# Patient Record
Sex: Female | Born: 1995 | Race: White | Hispanic: No | Marital: Single | Smoking: Current every day smoker
Health system: Southern US, Community
[De-identification: ages and names within clinical notes are randomized; demographics above are authoritative.]

## PROBLEM LIST (undated history)

## (undated) HISTORY — PX: TONSILLECTOMY: SUR1361

---
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
## 2017-02-12 ENCOUNTER — Observation Stay (HOSPITAL_COMMUNITY)
Admission: EM | Admit: 2017-02-12 | Discharge: 2017-02-13 | Disposition: A | Discharge status: Home / Self Care | Payer: Self-pay | Attending: Internal Medicine | Admitting: Internal Medicine

## 2017-02-12 ENCOUNTER — Observation Stay (HOSPITAL_COMMUNITY): Payer: Self-pay

## 2017-02-12 ENCOUNTER — Encounter (HOSPITAL_COMMUNITY): Payer: Self-pay | Admitting: *Deleted

## 2017-02-12 ENCOUNTER — Emergency Department (HOSPITAL_COMMUNITY): Payer: Self-pay

## 2017-02-12 DIAGNOSIS — J40 Bronchitis, not specified as acute or chronic: Secondary | ICD-10-CM | POA: Diagnosis present

## 2017-02-12 DIAGNOSIS — D72829 Elevated white blood cell count, unspecified: Secondary | ICD-10-CM

## 2017-02-12 DIAGNOSIS — R739 Hyperglycemia, unspecified: Secondary | ICD-10-CM

## 2017-02-12 DIAGNOSIS — J209 Acute bronchitis, unspecified: Principal | ICD-10-CM | POA: Insufficient documentation

## 2017-02-12 DIAGNOSIS — R0902 Hypoxemia: Secondary | ICD-10-CM | POA: Insufficient documentation

## 2017-02-12 DIAGNOSIS — J9601 Acute respiratory failure with hypoxia: Secondary | ICD-10-CM

## 2017-02-12 DIAGNOSIS — Z720 Tobacco use: Secondary | ICD-10-CM

## 2017-02-12 DIAGNOSIS — F17210 Nicotine dependence, cigarettes, uncomplicated: Secondary | ICD-10-CM | POA: Insufficient documentation

## 2017-02-12 DIAGNOSIS — R0602 Shortness of breath: Secondary | ICD-10-CM

## 2017-02-12 LAB — I-STAT CHEM 8, ED
BUN: 4 mg/dL — ABNORMAL LOW (ref 6–20)
Calcium, Ion: 1.07 mmol/L — ABNORMAL LOW (ref 1.15–1.40)
Chloride: 102 mmol/L (ref 101–111)
Creatinine, Ser: 0.6 mg/dL (ref 0.44–1.00)
Glucose, Bld: 120 mg/dL — ABNORMAL HIGH (ref 65–99)
HEMATOCRIT: 43 % (ref 36.0–46.0)
Hemoglobin: 14.6 g/dL (ref 12.0–15.0)
Potassium: 3.7 mmol/L (ref 3.5–5.1)
Sodium: 137 mmol/L (ref 135–145)
TCO2: 24 mmol/L (ref 0–100)

## 2017-02-12 LAB — CBC
HEMATOCRIT: 41.8 % (ref 36.0–46.0)
Hemoglobin: 14.2 g/dL (ref 12.0–15.0)
MCH: 28.5 pg (ref 26.0–34.0)
MCHC: 34 g/dL (ref 30.0–36.0)
MCV: 83.9 fL (ref 78.0–100.0)
PLATELETS: 395 10*3/uL (ref 150–400)
RBC: 4.98 MIL/uL (ref 3.87–5.11)
RDW: 12.6 % (ref 11.5–15.5)
WBC: 15.5 10*3/uL — ABNORMAL HIGH (ref 4.0–10.5)

## 2017-02-12 LAB — D-DIMER, QUANTITATIVE: D-Dimer, Quant: <0.27 ug/mL-FEU (ref 0.00–0.50)

## 2017-02-12 LAB — TROPONIN I: Troponin I: <0.03 ng/mL (ref <0.03)

## 2017-02-12 MED ORDER — NICOTINE 21 MG/24HR TD PT24
21.0000 mg | MEDICATED_PATCH | Freq: Every day | TRANSDERMAL | Status: DC
  Administered 2017-02-12 – 2017-02-13 (×2): 21 mg via TRANSDERMAL
  Filled 2017-02-12 (×2): qty 1

## 2017-02-12 MED ORDER — ACETAMINOPHEN 325 MG PO TABS: 650.0000 mg | ORAL_TABLET | Freq: Four times a day (QID) | ORAL | Status: DC | PRN

## 2017-02-12 MED ORDER — ACETAMINOPHEN 650 MG RE SUPP: 650.0000 mg | Freq: Four times a day (QID) | RECTAL | Status: DC | PRN

## 2017-02-12 MED ORDER — METHYLPREDNISOLONE SODIUM SUCC 40 MG IJ SOLR
40.0000 mg | Freq: Two times a day (BID) | INTRAMUSCULAR | Status: DC
  Administered 2017-02-12 – 2017-02-13 (×2): 40 mg via INTRAVENOUS
  Filled 2017-02-12 (×2): qty 1

## 2017-02-12 MED ORDER — SODIUM CHLORIDE 0.9 % IV SOLN
INTRAVENOUS | Status: DC
  Administered 2017-02-12: 17:00:00 via INTRAVENOUS

## 2017-02-12 MED ORDER — IPRATROPIUM-ALBUTEROL 0.5-2.5 (3) MG/3ML IN SOLN: 3.0000 mL | RESPIRATORY_TRACT | Status: DC | PRN

## 2017-02-12 MED ORDER — ONDANSETRON HCL 4 MG/2ML IJ SOLN: 4.0000 mg | Freq: Four times a day (QID) | INTRAMUSCULAR | Status: DC | PRN

## 2017-02-12 MED ORDER — AZITHROMYCIN 250 MG PO TABS
500.0000 mg | ORAL_TABLET | Freq: Every day | ORAL | Status: AC
  Administered 2017-02-12: 500 mg via ORAL
  Filled 2017-02-12: qty 2

## 2017-02-12 MED ORDER — IPRATROPIUM-ALBUTEROL 0.5-2.5 (3) MG/3ML IN SOLN
3.0000 mL | Freq: Four times a day (QID) | RESPIRATORY_TRACT | Status: DC
  Administered 2017-02-12 – 2017-02-13 (×3): 3 mL via RESPIRATORY_TRACT
  Filled 2017-02-12 (×3): qty 3

## 2017-02-12 MED ORDER — AZITHROMYCIN 250 MG PO TABS: 250.0000 mg | ORAL_TABLET | Freq: Every day | ORAL | Status: DC

## 2017-02-12 MED ORDER — IOPAMIDOL (ISOVUE-370) INJECTION 76%
100.0000 mL | Freq: Once | INTRAVENOUS | Status: AC | PRN
  Administered 2017-02-12: 100 mL via INTRAVENOUS

## 2017-02-12 MED ORDER — GUAIFENESIN-DM 100-10 MG/5ML PO SYRP
5.0000 mL | ORAL_SOLUTION | ORAL | Status: DC | PRN
  Administered 2017-02-13: 5 mL via ORAL
  Filled 2017-02-12: qty 5

## 2017-02-12 MED ORDER — ONDANSETRON HCL 4 MG PO TABS: 4.0000 mg | ORAL_TABLET | Freq: Four times a day (QID) | ORAL | Status: DC | PRN

## 2017-02-12 MED ORDER — POLYETHYLENE GLYCOL 3350 17 G PO PACK: 17.0000 g | PACK | Freq: Every day | ORAL | Status: DC | PRN

## 2017-02-12 MED ORDER — ALBUTEROL (5 MG/ML) CONTINUOUS INHALATION SOLN
15.0000 mg/h | INHALATION_SOLUTION | Freq: Once | RESPIRATORY_TRACT | Status: AC
  Administered 2017-02-12: 15 mg/h via RESPIRATORY_TRACT
  Filled 2017-02-12: qty 20

## 2017-02-12 MED ORDER — ALBUTEROL SULFATE (2.5 MG/3ML) 0.083% IN NEBU
5.0000 mg | INHALATION_SOLUTION | Freq: Once | RESPIRATORY_TRACT | Status: AC
  Administered 2017-02-12: 5 mg via RESPIRATORY_TRACT
  Filled 2017-02-12: qty 6

## 2017-02-12 MED ORDER — ENOXAPARIN SODIUM 40 MG/0.4ML ~~LOC~~ SOLN
40.0000 mg | SUBCUTANEOUS | Status: DC
  Administered 2017-02-12: 40 mg via SUBCUTANEOUS
  Filled 2017-02-12: qty 0.4

## 2017-02-12 MED ORDER — PREDNISONE 20 MG PO TABS
40.0000 mg | ORAL_TABLET | Freq: Once | ORAL | Status: AC
  Administered 2017-02-12: 40 mg via ORAL
  Filled 2017-02-12: qty 2

## 2017-02-12 NOTE — ED Notes (Signed)
Pt taken to CT.

## 2017-02-12 NOTE — ED Notes (Signed)
Patient ambulated around nurses station. Patient complains of shortness of breath. HR 133 and O2 dropped to 87%

## 2017-02-12 NOTE — ED Provider Notes (Addendum)
[facility]-EMERGENCY DEPT Provider Note   CSN: [id number] Arrival date & time: 02/12/17  0857     History   Chief Complaint Chief Complaint  Patient presents with  . Shortness of Breath    HPI JOLAN MEALOR is a 21 y.o. female.Complains of cough onset yesterday afternoon accompanied by shortness of breath cough was productive one time of "clear slobber" otherwise nonproductive. No fever no chest pain no other associated symptoms are treated self with Mucinex and a nasal spray, without relief nothing makes symptoms better or worse.  HPI  History reviewed. No pertinent past medical history.  There are no active problems to display for this patient.   Past Surgical History:  Procedure Laterality Date  . TONSILLECTOMY      OB History    No data available       Home Medications    Prior to Admission medications   Medication Sig Start Date End Date Taking? Authorizing Provider  albuterol (PROVENTIL HFA;VENTOLIN HFA) 108 (90 BASE) MCG/ACT inhaler Inhale 2 puffs into the lungs every 2 (two) hours as needed for wheezing or shortness of breath (or coughing). Patient not taking: Reported on 01/02/2015 10/27/13   Dione Booze, MD  etonogestrel (NEXPLANON) 68 MG IMPL implant 1 each by Subdermal route once.    Historical Provider, MD  hydrOXYzine (ATARAX/VISTARIL) 25 MG tablet Take 1-2 tablets (25-50 mg total) by mouth every 6 (six) hours as needed for itching (may cause drowsiness). 01/22/15   John Molpus, MD  ibuprofen (ADVIL,MOTRIN) 200 MG tablet Take 400 mg by mouth every 6 (six) hours as needed for headache.    Historical Provider, MD  methylPREDNISolone (MEDROL DOSEPAK) 4 MG tablet Take tapering dose per package instructions. 01/22/15   John Molpus, MD  ranitidine (ZANTAC) 150 MG tablet Take 1 tablet (150 mg total) by mouth 2 (two) times daily. 01/01/15   Hope Orlene Och, NP    Family History No family history on file.  Social History Social History  Substance Use Topics  . Smoking  status: Current Every Day Smoker    Packs/day: 0.50    Types: Cigarettes  . Smokeless tobacco: Never Used  . Alcohol use No  Smokes 1.5 packs per day and no alcohol no drug use   Allergies   Amoxicillin   Review of Systems Review of Systems  Constitutional: Negative.   HENT: Negative.   Respiratory: Positive for cough and shortness of breath.   Cardiovascular: Negative.   Gastrointestinal: Negative.   Musculoskeletal: Negative.   Skin: Negative.   Neurological: Negative.   Psychiatric/Behavioral: Negative.   All other systems reviewed and are negative.    Physical Exam Updated Vital Signs BP 133/88 (BP Location: Left Arm)   Pulse (!) 105   Temp 98.3 F (36.8 C) (Oral)   Resp (!) 32   Ht 5' (1.524 m)   Wt 250 lb (113.4 kg)   LMP 01/22/2017   SpO2 92%   BMI 48.82 kg/m   Physical Exam  Constitutional: She appears well-developed and well-nourished. No distress.  Respiratory distress, speaks in paragraphs  HENT:  Head: Normocephalic and atraumatic.  Eyes: Conjunctivae are normal. Pupils are equal, round, and reactive to light.  Neck: Neck supple. No tracheal deviation present. No thyromegaly present.  Cardiovascular: Normal rate and regular rhythm.   No murmur heard. Pulmonary/Chest: Effort normal. She has wheezes.  Expiratory wheezes  Abdominal: Soft. Bowel sounds are normal. She exhibits no distension. There is no tenderness.  Obese  Musculoskeletal: Normal  range of motion. She exhibits no edema or tenderness.  Neurological: She is alert. Coordination normal.  Skin: Skin is warm and dry. No rash noted.  Psychiatric: She has a normal mood and affect.  Nursing note and vitals reviewed.    ED Treatments / Results  Labs (all labs ordered are listed, but only abnormal results are displayed) Labs Reviewed - No data to display  EKG  EKG Interpretation  Date/Time:  Sunday February 12 2017 09:06:38 EDT Ventricular Rate:  103 PR Interval:    QRS  Duration: 104 QT Interval:  341 QTC Calculation: 447 R Axis:   173 Text Interpretation:  Sinus tachycardia Right axis deviation Low voltage, precordial leads Baseline wander in lead(s) V4 V5 V6 No old tracing to compare Confirmed by Ethelda Chick  MD, Chasten Blaze [phone]) on 02/12/2017 9:15:48 AM      Chest x-ray viewed by me. 10:30 AM breathing is improved after nebulized treatment. She continues to have diffuse and scattered rhonchi appears in no respiratory distress. ED technician reported to me that upon ambulation patient became Tachypneic(she did not count respiratory rate) and pulse oximetry dropped to 87% on room air. Continuous nebulization with albuterol and prednisone ordered Radiology No results found.  Procedures Procedures (including critical care time)  Medications Ordered in ED Medications  albuterol (PROVENTIL) (2.5 MG/3ML) 0.083% nebulizer solution 5 mg (5 mg Nebulization Given 02/12/17 0914)   I-STAT 8 shows serum sodium 137 potassium 3.7 chloride 102 bicarbonate 24 glucose 120 BUN 4 pending 0.6 hemoglobin 14.6 Results for orders placed or performed during the hospital encounter of 02/12/17  CBC  Result Value Ref Range   WBC 15.5 (H) 4.0 - 10.5 K/uL   RBC 4.98 3.87 - 5.11 MIL/uL   Hemoglobin 14.2 12.0 - 15.0 g/dL   HCT 60.4 54.0 - 98.1 %   MCV 83.9 78.0 - 100.0 fL   MCH 28.5 26.0 - 34.0 pg   MCHC 34.0 30.0 - 36.0 g/dL   RDW 19.1 47.8 - 29.5 %   Platelets 395 150 - 400 K/uL  I-stat chem 8, ed  Result Value Ref Range   Sodium 137 135 - 145 mmol/L   Potassium 3.7 3.5 - 5.1 mmol/L   Chloride 102 101 - 111 mmol/L   BUN 4 (L) 6 - 20 mg/dL   Creatinine, Ser 6.21 0.44 - 1.00 mg/dL   Glucose, Bld 308 (H) 65 - 99 mg/dL   Calcium, Ion 6.57 (L) 1.15 - 1.40 mmol/L   TCO2 24 0 - 100 mmol/L   Hemoglobin 14.6 12.0 - 15.0 g/dL   HCT 84.6 96.2 - 95.2 %   Dg Chest 2 View  Result Date: 02/12/2017 CLINICAL DATA:  Shortness of breath.  Started yesterday. EXAM: CHEST  2 VIEW COMPARISON:   None. FINDINGS: There is no focal consolidation. There is no pleural effusion or pneumothorax. The heart and mediastinal contours are unremarkable. The osseous structures are unremarkable. IMPRESSION: No active cardiopulmonary disease. Electronically Signed   By: Elige Ko   On: 02/12/2017 09:57  Chest x-ray viewed by me  Initial Impression / Assessment and Plan / ED Course  I have reviewed the triage vital signs and the nursing notes.  Pertinent labs & imaging results that were available during my care of the patient were reviewed by me and considered in my medical decision making (see chart for details). Hospitalist consulted for overnight stay as patient has oxygen requirement    12:40 PM after treatment with one hour of continuous nebulization  with albuterol and oral prednisone patient became dyspneic with walking in the emergency department and pulse oximetry dropped to 88% consistent with hypoxia D-dimer and respiratory viral panel ordered by me at hospitalist request will be checked by hospitalist physician  Final Clinical Impressions(s) / ED Diagnoses  Diagnoses #1 acute bronchitis #2 hypoxemia Final diagnoses:  None    New Prescriptions New Prescriptions   No medications on file     Doug Sou, MD 02/12/17 1330    Doug Sou, MD 02/12/17 1331

## 2017-02-12 NOTE — ED Notes (Signed)
Pt placed on 2L 02 [place].   

## 2017-02-12 NOTE — H&P (Signed)
History and Physical    [name]:[id number] DOB: 12/12/1995 DOA: 02/12/2017  PCP: No PCP Per Patient   Patient coming from: Home  Chief Complaint: Shortness of Breath, Wheezing, Cough and Chest Pain  HPI: ALEIGH Stokes is a 21 y.o. female with medical history significant of Morbid Obesity (BMI 48.9), Significant Tobacco Abuse, and no other real medical Hx who presented to [facility] ED with a cc of Chest pain, SOB, cough and wheezing for 2 days now. Patient states that she awoke form her sleep with midsternal Chest Pain and was Nauseous and vomited once and became diaphoretic. She subsequently became short of breath and began to have a cough with wheezing. She also complained of chest tightness and described the pain as "some one sitting on her chest." She admitted to coughing up clear sputum. Denied any fevers, chills, or myalgias. Has been around her cousin who was hospitalized with a PNA. Denied any constipation or diarrhea, and no swelling in legs. She still actively smokes and quite extensively at 1.5 PPD. States symptoms were similar to her bronchitis a few years ago but are worse. [facility] was called to admit for Acute Respiratory Failure with Hypoxia and Acute Bronchitis.  ED Course: Was given a continuous Neb Treatment, po Prednisone, Had a CXR done and labwork.  Was placed on 2 Liters of O2 because she became hypoxic when ambulating and had an elevated RR.   Review of Systems: As per HPI otherwise 10 point review of systems negative. Denied any lightheadedness or dizziness.   PAST MEDICAL HISTORY -Morbid Obesity -Tobacco Abuse -Hx of Bronchitis   Past Surgical History:  Procedure Laterality Date  . TONSILLECTOMY     SOCIAL HISTORY She reports that she has been smoking Cigarettes.  She has been smoking about 1.50 packs per day. She has never used smokeless tobacco. She reports that she does not drink alcohol or use drugs. She lives with her boyfriend in [place].   Allergies   Allergen Reactions  . Amoxicillin Nausea And Vomiting and Rash   FAMILY HISTORY -Patient's Mother has Hypertension, Patients Father is deceased and had Liver Cirrhosis and was and Alcoholic and Drug user.   Prior to Admission medications   Medication Sig Start Date End Date Taking? Authorizing Provider  ibuprofen (ADVIL,MOTRIN) 200 MG tablet Take 400 mg by mouth every 6 (six) hours as needed for headache.   Yes Historical Provider, MD  albuterol (PROVENTIL HFA;VENTOLIN HFA) 108 (90 BASE) MCG/ACT inhaler Inhale 2 puffs into the lungs every 2 (two) hours as needed for wheezing or shortness of breath (or coughing). Patient not taking: Reported on 01/02/2015 10/27/13   Dione Booze, MD  etonogestrel (NEXPLANON) 68 MG IMPL implant 1 each by Subdermal route once.    Historical Provider, MD  hydrOXYzine (ATARAX/VISTARIL) 25 MG tablet Take 1-2 tablets (25-50 mg total) by mouth every 6 (six) hours as needed for itching (may cause drowsiness). Patient not taking: Reported on 02/12/2017 01/22/15   Paula Libra, MD  methylPREDNISolone (MEDROL DOSEPAK) 4 MG tablet Take tapering dose per package instructions. Patient not taking: Reported on 02/12/2017 01/22/15   Paula Libra, MD  ranitidine (ZANTAC) 150 MG tablet Take 1 tablet (150 mg total) by mouth 2 (two) times daily. Patient not taking: Reported on 02/12/2017 01/01/15   Janne Napoleon, NP   Physical Exam: Vitals:   02/12/17 1230 02/12/17 1245 02/12/17 1300 02/12/17 1415  BP: 103/75  (!) 118/107 103/64  Pulse:  (!) 116 (!) 117 Marland Kitchen)  110  Resp:  (!) 22 (!) 24 20  Temp:      TempSrc:      SpO2:  94% 94% 95%  Weight:      Height:       Constitutional: WN/WD morbidly obese Caucasian female, in mild respiratory distress Eyes: Lids and conjunctivae normal, sclerae anicteric  ENMT: External Ears, Nose appear normal. Grossly normal hearing. Mucous membranes are moist. Posterior pharynx clear of any exudate or lesions.  Neck: Appears normal, supple, no cervical  masses, normal ROM, no appreciable thyromegaly, no JVD Respiratory: Severely diminished to auscultation with bilateral wheezing and some rhonchi; no rales or crackles. Slightly increased respiratory effort. No accessory muscle use.  Cardiovascular: Tachycardic Rate and Regular Rhythm, no murmurs / rubs / gallops. S1 and S2 auscultated. No pitting extremity edema. 2+ pedal pulses.  Abdomen: Soft, non-tender, non-distended. No masses palpated. No appreciable hepatosplenomegaly. Bowel sounds positive x4.  GU: Deferred. Musculoskeletal: No clubbing / cyanosis of digits/nails. No joint deformity upper and lower extremities. Skin: No rashes, lesions, ulcers on limited skin evaluation. No induration; Warm and dry.  Neurologic: CN 2-12 grossly intact with no focal deficits. Sensation intact in all 4 Extremities. Romberg sign cerebellar reflexes not assessed.  Psychiatric: Normal judgment and insight. Alert and oriented x 3. Normal mood and appropriate affect.   Labs on Admission: I have personally reviewed following labs and imaging studies  CBC:  Recent Labs Lab 02/12/17 1240 02/12/17 1252  WBC 15.5*  --   HGB 14.2 14.6  HCT 41.8 43.0  MCV 83.9  --   PLT 395  --    Basic Metabolic Panel:  Recent Labs Lab 02/12/17 1252  NA 137  K 3.7  CL 102  GLUCOSE 120*  BUN 4*  CREATININE 0.60   GFR: Estimated Creatinine Clearance: 129.8 mL/min (by C-G formula based on SCr of 0.6 mg/dL). Liver Function Tests: No results for input(s): AST, ALT, ALKPHOS, BILITOT, PROT, ALBUMIN in the last 168 hours. No results for input(s): LIPASE, AMYLASE in the last 168 hours. No results for input(s): AMMONIA in the last 168 hours. Coagulation Profile: No results for input(s): INR, PROTIME in the last 168 hours. Cardiac Enzymes: No results for input(s): CKTOTAL, CKMB, CKMBINDEX, TROPONINI in the last 168 hours. BNP (last 3 results) No results for input(s): PROBNP in the last 8760 hours. HbA1C: No results  for input(s): HGBA1C in the last 72 hours. CBG: No results for input(s): GLUCAP in the last 168 hours. Lipid Profile: No results for input(s): CHOL, HDL, LDLCALC, TRIG, CHOLHDL, LDLDIRECT in the last 72 hours. Thyroid Function Tests: No results for input(s): TSH, T4TOTAL, FREET4, T3FREE, THYROIDAB in the last 72 hours. Anemia Panel: No results for input(s): VITAMINB12, FOLATE, FERRITIN, TIBC, IRON, RETICCTPCT in the last 72 hours. Urine analysis:    Component Value Date/Time   COLORURINE YELLOW 05/22/2012 1501   APPEARANCEUR HAZY (A) 05/22/2012 1501   LABSPEC >1.030 (H) 05/22/2012 1501   PHURINE 5.5 05/22/2012 1501   GLUCOSEU NEGATIVE 05/22/2012 1501   HGBUR NEGATIVE 05/22/2012 1501   BILIRUBINUR NEGATIVE 05/22/2012 1501   KETONESUR NEGATIVE 05/22/2012 1501   PROTEINUR NEGATIVE 05/22/2012 1501   UROBILINOGEN 0.2 05/22/2012 1501   NITRITE POSITIVE (A) 05/22/2012 1501   LEUKOCYTESUR NEGATIVE 05/22/2012 1501   Sepsis Labs: !!!!!!!!!!!!!!!!!!!!!!!!!!!!!!!!!!!!!!!!!!!! (procalcitonin:4,lacticidven:4) )No results found for this or any previous visit (from the past 240 hour(s)).   Radiological Exams on Admission: Dg Chest 2 View  Result Date: 02/12/2017 CLINICAL DATA:  Shortness of  breath.  Started yesterday. EXAM: CHEST  2 VIEW COMPARISON:  None. FINDINGS: There is no focal consolidation. There is no pleural effusion or pneumothorax. The heart and mediastinal contours are unremarkable. The osseous structures are unremarkable. IMPRESSION: No active cardiopulmonary disease. Electronically Signed   By: Elige Ko   On: 02/12/2017 09:57    EKG: Independently reviewed. Showed Sinus Rhythm at a rate of 99. No evidence of ST elevation or depression on my interpretation.   Assessment/Plan Active Problems:   Bronchitis  Acute Respiratory Failure with Hypoxia -Patient was Hypoxic Ambulating in the ED -C/w O2 via [place] and Maintain O2 Saturations >92% -Continuous Pulse Oximetry  and Wean O2 as tolerated -Repeat CXR in AM -Patient admits to being exposed to Sick Contact -Check Respiratory Virus Panel and Droplet Precautions -Check CTA Chest to r/o PE; Even though D-Dimer was <0.027 patient Smokes 1.5 PPD, is Obese, Having Chest Pain, and is on Birth Control -Will ned Home O2 Screen prior to D/C  Acute Bronchitis/Wheezing r/o Infectious Etiology and PE -Started Gentle IVF Rehydration with NS at 75 mL/hr -Patients was still wheezing even after Continuous Albuterol Neb -Leukocytosis was 15.5 -Placed on DuoNebs q6h and q2hprn -Placed on Azithromycin po for 5 Days -Given IV Solumedrol 40 BID as she was still significantly wheezing.  -Continue to Monitor Clinical response to intervention  Leukocytosis -Patient's WBC was 15.5 on Admission -Likely from Bronchitis -Chest Xray showed There is no focal consolidation. There is no pleural effusion or pneumothorax. The heart and mediastinal contours are unremarkable.The osseous structures are unremarkable. -Obtain Blood Cx x2 and Sputum Cx -Repeat CBC in AM  Chest Pain r/o ACS -Has risk factors of Morbid Obesity and Active Tobacco Abuse -Cycle Cardiac Troponin I x 3 -Repeat EKG in AM  Tobacco Abuse -Smoking Cessastion Counseling given -Nicotine Patch 21 mg TD   Hyperglycemia -BS was 120 on Admission -Obtain HbA1c -Continue to Monitor and if Necessary place on SSI  Morbid Obesity -BMI 48.9 -Will need Dietician Consultation  DVT prophylaxis: Lovenox 40 mg sq q24h Code Status: FULL CODE Family Communication: Discussed with Boyfriend at bedside Disposition Plan: Discharge Home Consults called: None Admission status: Tele Obs  Merlene Laughter, D.O. [facility] Pager [phone]  If 7PM-7AM, please contact night-coverage [email or web address] Password [email or web address]  02/12/2017, 2:45 PM

## 2017-02-12 NOTE — ED Triage Notes (Signed)
Pt comes in with shortness of breath starting yesterday. She has had a productive cough since yesterday as well. Pt has bilateral inspiratory and expiratory wheezing noted. C/o chest tightness.

## 2017-02-12 NOTE — ED Notes (Signed)
Pt walked around nurses station x2 on RA and held sats at 94% on first lap and dipped down to 88% at end of second lap.  RR were 35 and even, pt states she felt jittery from albuterol tx.  Dr. Vivia Birmingham notified.

## 2017-02-12 NOTE — ED Notes (Signed)
I-stat result not crossing over into system.  Dr. Shela Commons notified and is free texting results into his note.

## 2017-02-12 NOTE — ED Notes (Signed)
Attempting to get IV access.  ER physician at bedside.  Ok to hold off on IV access at this time.

## 2017-02-13 DIAGNOSIS — R6510 Systemic inflammatory response syndrome (SIRS) of non-infectious origin without acute organ dysfunction: Secondary | ICD-10-CM

## 2017-02-13 DIAGNOSIS — B348 Other viral infections of unspecified site: Secondary | ICD-10-CM

## 2017-02-13 DIAGNOSIS — J181 Lobar pneumonia, unspecified organism: Secondary | ICD-10-CM

## 2017-02-13 LAB — RESPIRATORY PANEL BY PCR
ADENOVIRUS-RVPPCR: NOT DETECTED
BORDETELLA PERTUSSIS-RVPCR: NOT DETECTED
CHLAMYDOPHILA PNEUMONIAE-RVPPCR: NOT DETECTED
CORONAVIRUS NL63-RVPPCR: NOT DETECTED
Coronavirus 229E: NOT DETECTED
Coronavirus HKU1: NOT DETECTED
Coronavirus OC43: NOT DETECTED
INFLUENZA A-RVPPCR: NOT DETECTED
INFLUENZA B-RVPPCR: NOT DETECTED
MYCOPLASMA PNEUMONIAE-RVPPCR: NOT DETECTED
Metapneumovirus: NOT DETECTED
PARAINFLUENZA VIRUS 3-RVPPCR: NOT DETECTED
PARAINFLUENZA VIRUS 4-RVPPCR: NOT DETECTED
Parainfluenza Virus 1: NOT DETECTED
Parainfluenza Virus 2: NOT DETECTED
RESPIRATORY SYNCYTIAL VIRUS-RVPPCR: NOT DETECTED
RHINOVIRUS / ENTEROVIRUS - RVPPCR: DETECTED — AB

## 2017-02-13 LAB — COMPREHENSIVE METABOLIC PANEL
ALT: 17 U/L (ref 14–54)
ANION GAP: 8 (ref 5–15)
AST: 17 U/L (ref 15–41)
Albumin: 4.1 g/dL (ref 3.5–5.0)
Alkaline Phosphatase: 66 U/L (ref 38–126)
BILIRUBIN TOTAL: 0.6 mg/dL (ref 0.3–1.2)
BUN: 6 mg/dL (ref 6–20)
CALCIUM: 9.2 mg/dL (ref 8.9–10.3)
CO2: 23 mmol/L (ref 22–32)
Chloride: 106 mmol/L (ref 101–111)
Creatinine, Ser: 0.61 mg/dL (ref 0.44–1.00)
GFR calc Af Amer: >60 mL/min (ref >60)
Glucose, Bld: 152 mg/dL — ABNORMAL HIGH (ref 65–99)
POTASSIUM: 4 mmol/L (ref 3.5–5.1)
Sodium: 137 mmol/L (ref 135–145)
TOTAL PROTEIN: 7.7 g/dL (ref 6.5–8.1)

## 2017-02-13 LAB — CBC
HEMATOCRIT: 41.1 % (ref 36.0–46.0)
HEMOGLOBIN: 13.8 g/dL (ref 12.0–15.0)
MCH: 28.3 pg (ref 26.0–34.0)
MCHC: 33.6 g/dL (ref 30.0–36.0)
MCV: 84.4 fL (ref 78.0–100.0)
Platelets: 325 10*3/uL (ref 150–400)
RBC: 4.87 MIL/uL (ref 3.87–5.11)
RDW: 12.8 % (ref 11.5–15.5)
WBC: 14 10*3/uL — AB (ref 4.0–10.5)

## 2017-02-13 LAB — HIV ANTIBODY (ROUTINE TESTING W REFLEX): HIV SCREEN 4TH GENERATION: NONREACTIVE

## 2017-02-13 LAB — HEMOGLOBIN A1C
HEMOGLOBIN A1C: 5.3 % (ref 4.8–5.6)
Mean Plasma Glucose: 105 mg/dL

## 2017-02-13 LAB — TROPONIN I: Troponin I: <0.03 ng/mL (ref <0.03)

## 2017-02-13 MED ORDER — LEVOFLOXACIN 750 MG PO TABS: 750.0000 mg | ORAL_TABLET | Freq: Every day | ORAL | 0 refills | Status: DC

## 2017-02-13 MED ORDER — ALBUTEROL SULFATE HFA 108 (90 BASE) MCG/ACT IN AERS: 2.0000 | INHALATION_SPRAY | RESPIRATORY_TRACT | 0 refills | Status: DC | PRN

## 2017-02-13 MED ORDER — DM-GUAIFENESIN ER 30-600 MG PO TB12
1.0000 | ORAL_TABLET | Freq: Two times a day (BID) | ORAL | Status: DC
  Administered 2017-02-13: 1 via ORAL
  Filled 2017-02-13: qty 1

## 2017-02-13 MED ORDER — DM-GUAIFENESIN ER 30-600 MG PO TB12: 1.0000 | ORAL_TABLET | Freq: Two times a day (BID) | ORAL | 0 refills | Status: DC

## 2017-02-13 MED ORDER — PREDNISONE 10 MG (21) PO TBPK: ORAL_TABLET | ORAL | 0 refills | Status: DC

## 2017-02-13 MED ORDER — NICOTINE 21 MG/24HR TD PT24: 21.0000 mg | MEDICATED_PATCH | Freq: Every day | TRANSDERMAL | 0 refills | Status: DC

## 2017-02-13 MED ORDER — LEVOFLOXACIN IN D5W 750 MG/150ML IV SOLN
750.0000 mg | INTRAVENOUS | Status: DC
Start: 2017-02-13 — End: 2017-02-13

## 2017-02-13 NOTE — Care Management Note (Signed)
Case Management Note  Patient Details  Name: Patricia Stokes MRN: [id number] Date of Birth: 05-05-1996  Subjective/Objective:                  Pt admitted with bronchitis. She is from home, lives in [place] and is here visiting her mother. She is uninsured and has no PCP. No other needs communicated.    Action/Plan: She is discharging home today with self care. Pt was provided with information on clinics in [place] to follow up with, she will be responsible for contacting them herself. Pt given MATCH voucher to help with cost of medications.   Expected Discharge Date:  02/13/17               Expected Discharge Plan:  Home/Self Care  In-House Referral:  NA  Discharge planning Services  [facility]igent [facility] Clinic, CM Consult, [facility]  Post Acute Care Choice:  NA Choice offered to:  NA  Status of Service:  Completed, signed off  Malcolm Metro, RN 02/13/2017, 1:33 PM

## 2017-02-13 NOTE — Progress Notes (Signed)
Pt discharged home today per Dr. Marland Mcalpine.  Pt's IV site D/C'd and WDL.  Pt's VSS.  Pt provided with home medication list, discharge instructions and prescriptions.  Verbalized understanding.  Pt ambulated off floor in stable condition accompanied by RN.

## 2017-02-13 NOTE — Discharge Summary (Signed)
Physician Discharge Summary  BRENLEIGH COLLET ZOX:[id number] DOB: Feb 27, 1996 DOA: 02/12/2017  PCP: No PCP Per Patient  Admit date: 02/12/2017 Discharge date: 02/13/2017  Admitted From: Home Disposition:  Home  Recommendations for Outpatient Follow-up:  1. Needs to Establish with and Follow up with PCP in 1-2 weeks 2. Please obtain CMP/CBC in one week 3. Repeat CXR in 4-6 weeks to ensure clearing of PNA 4. Follow up on the Sputum Cx as an outpatient  Home Health: No Equipment/Devices: None  Discharge Condition: Stable CODE STATUS: FULL Diet recommendation: Regular Diet  Brief/Interim Summary: ALSIE YOUNES is a 21 y.o. female with medical history significant of Morbid Obesity (BMI 48.9), Significant Tobacco Abuse, and no other real medical Hx who presented to [facility] ED with a cc of Chest pain, SOB, cough and wheezing for 2 days now. Patient states that she awoke form her sleep with midsternal Chest Pain and was Nauseous and vomited once and became diaphoretic. She subsequently became short of breath and began to have a cough with wheezing. She also complained of chest tightness and described the pain as "some one sitting on her chest." She admitted to coughing up clear sputum. Denied any fevers, chills, or myalgias. Has been around her cousin who was hospitalized with a PNA. Denied any constipation or diarrhea, and no swelling in legs. She still actively smokes and quite extensively at 1.5 PPD. States symptoms were similar to her bronchitis a few years ago but are worse. [facility] was called to admit for Acute Respiratory Failure with Hypoxia and Acute Bronchitis. Further workup revealed a Right Upper Lobe PNA and a Respiratory Virus Panel positive for Rhinovirus/Enterovirus. Patient's Abx where changed to Levofloxacin and she was ambulated and did not require Home O2. Patient felt significantly better and also felt as if her wheezing had improved. She was deemed medically stable and transitioned to  oral antibiotics with Levofloxacin and oral Prednisone Taper. At this time she was ready to be D/C'd Home and will need to follow up with and Establish with a PCP at a [facility] [place]. It was strongly advised to her to quit smoking.   Discharge Diagnoses:  Active Problems:   Bronchitis  Acute Respiratory Failure with Hypoxia from likely Right Upper Lobe PNA and Bronchitis, improved -Patient was Hypoxic Ambulating in the ED -Weaned off of O2 -Patient admits to being exposed to Sick Contact -Checked Respiratory Virus Panel and was Positive for Rhinovirus/Enterovirus -Checked CTA Chest to r/o PE even though D-Dimer was <0.027 patient Smokes 1.5 PPD, is Obese, Having Chest Pain, and is on Birth Control -> CTA showed no evidence of Pulmonary Evidence but did show mild nodular airspace disease in the posterior segment of the Right Upper Lobe and a Patchy Area of Ground-Glass Opacity in the Right Upper Lobe Concerning for PNA -Home O2 Screen prior to D/C done and patient did not require Home O2  SIRS from Acute Community Acquired Righty Upper Lobe Pneumonia in conjunction with Acute Bronchitis/Wheezing from Rhinovirus/Enterovirus -Was Tachycardic and Tachypenic with Leukocytosis and focal infection of Right Upper Lobe PNA -Was gently Hydrated with IVF Rehydration with NS at 75 mL/hr -Patient's Wheezing significantly improvevd -Leukocytosis went from 15.5 -> 14.0 -Placed on DuoNebs q6h and q2hprn while hospitalized; Will D/C with Albuterol Inhaler -Placed on Azithromycin po for 5 Days but switched to Levofloxacin; Patient received 1 IV Dose and will be D/C'd on po Levofloxacin 750 mg po Daily x 7 days total -Given IV Solumedrol 40 BID  and transitioned to po Prednisone Taper -Sputum Cx Gram Stain showed Abundant WBC Present, predominantly PMN with few Squamous Epithelial Cells and Abundant Gram Positive Cocci and Moderate Gram Positive Rods and Few GNR Rods with Culture pending -C/w  Mucinex-DM for 5 days  -Will need to have Repeat CXR in 4-6 weeks -Will also need to follow up and establish with PCP at D/C  Leukocytosis, improved -Likely from above -Patient's WBC was 15.5 on Admission and improved to 14.0 -Chest Xray showed There is no focal consolidation. There is no pleural effusion or pneumothorax. The heart and mediastinal contours are unremarkable.The osseous structures are unremarkable. -Sputum Cx -Repeat CBC as an outpatient  Chest Pain r/o'd ACS -Has risk factors of Morbid Obesity and Active Tobacco Abuse -Cycle Cardiac Troponin I x 3 and were <0.03 x 3 -No Re-occurrence of chest pain  Tobacco Abuse -Smoking Cessastion Counseling given especially that she is on Birth Control  -Nicotine Patch 21 mg TD at D/C  Hyperglycemia -Likely Reactive -BS was 120 on Admission; Was 152 this AM -Obtained HbA1c and was 5.3  Morbid Obesity -BMI 48.9 -Weight Loss Counseling given  Discharge Instructions  Discharge Instructions    Call MD for:  difficulty breathing, headache or visual disturbances    Complete by:  As directed    Call MD for:  extreme fatigue    Complete by:  As directed    Call MD for:  persistant dizziness or light-headedness    Complete by:  As directed    Call MD for:  persistant nausea and vomiting    Complete by:  As directed    Call MD for:  severe uncontrolled pain    Complete by:  As directed    Call MD for:  temperature >100.4    Complete by:  As directed    Diet - low sodium heart healthy    Complete by:  As directed    Discharge instructions    Complete by:  As directed    Follow up with PCP and take all medications as prescribed. If symptoms change or worsen please return to the ED for evaluation.   Increase activity slowly    Complete by:  As directed      Allergies as of 02/13/2017      Reactions   Amoxicillin Nausea And Vomiting, Rash      Medication List    STOP taking these medications   etonogestrel 68 MG  Impl implant Commonly known as:  NEXPLANON   hydrOXYzine 25 MG tablet Commonly known as:  ATARAX/VISTARIL   ibuprofen 200 MG tablet Commonly known as:  ADVIL,MOTRIN   methylPREDNISolone 4 MG tablet Commonly known as:  MEDROL DOSEPAK   ranitidine 150 MG tablet Commonly known as:  ZANTAC     TAKE these medications   albuterol 108 (90 Base) MCG/ACT inhaler Commonly known as:  PROVENTIL HFA;VENTOLIN HFA Inhale 2 puffs into the lungs every 2 (two) hours as needed for wheezing or shortness of breath (or coughing).   dextromethorphan-guaiFENesin 30-600 MG 12hr tablet Commonly known as:  MUCINEX DM Take 1 tablet by mouth 2 (two) times daily.   levofloxacin 750 MG tablet Commonly known as:  LEVAQUIN Take 1 tablet (750 mg total) by mouth daily.   nicotine 21 mg/24hr patch Commonly known as:  NICODERM CQ - dosed in mg/24 hours Place 1 patch (21 mg total) onto the skin daily. Start taking on:  02/14/2017   predniSONE 10 MG (21) Tbpk tablet Commonly known  as:  STERAPRED UNI-PAK 21 TAB Take 6 Tablets Day 1, 5 Tablets Day 2, 4 Tablets Day 3, 3 Tablets Day 4, 2 Tablets Day 5, 1 Tablet Day 6 and then Stop Day 7       Allergies  Allergen Reactions  . Amoxicillin Nausea And Vomiting and Rash   Consultations: -None  Procedures/Studies: Dg Chest 2 View  Result Date: 02/12/2017 CLINICAL DATA:  Shortness of breath.  Started yesterday. EXAM: CHEST  2 VIEW COMPARISON:  None. FINDINGS: There is no focal consolidation. There is no pleural effusion or pneumothorax. The heart and mediastinal contours are unremarkable. The osseous structures are unremarkable. IMPRESSION: No active cardiopulmonary disease. Electronically Signed   By: Elige Ko   On: 02/12/2017 09:57   Ct Angio Chest Pe W Or Wo Contrast  Result Date: 02/12/2017 CLINICAL DATA:  Shortness of breath, wheezing, productive cough EXAM: CT ANGIOGRAPHY CHEST WITH CONTRAST TECHNIQUE: Multidetector CT imaging of the chest was performed  using the standard protocol during bolus administration of intravenous contrast. Multiplanar CT image reconstructions and MIPs were obtained to evaluate the vascular anatomy. CONTRAST:  100 mL Isovue 370 COMPARISON:  None. FINDINGS: Cardiovascular: Satisfactory opacification of the pulmonary arteries to the segmental level. No evidence of pulmonary embolism. Normal heart size. No pericardial effusion. Normal caliber thoracic aorta. No thoracic aortic dissection. Mediastinum/Nodes: No enlarged mediastinal, hilar, or axillary lymph nodes. Thyroid gland, trachea, and esophagus demonstrate no significant findings. Lungs/Pleura: Patchy area of ground-glass opacity in the right upper lobe. Nodular airspace disease in the right upper lobe in the posterior segment. Mild lingular atelectasis. No pleural effusion or pneumothorax. Upper Abdomen: No acute abnormality. Musculoskeletal: No chest wall abnormality. No acute or significant osseous findings. Review of the MIP images confirms the above findings. IMPRESSION: 1. No evidence of pulmonary embolus. 2. Mile nodular airspace disease in the posterior segment of the right upper lobe and a patchy area of ground-glass opacity in the right upper lobe most concerning for pneumonia. Electronically Signed   By: Elige Ko   On: 02/12/2017 16:08     Subjective: Seen and examined at bedside and was feeling much better. No nausea or vomiting. States wheezing has improved and she has no more Chest Pain.   Discharge Exam: Vitals:   02/12/17 1638 02/12/17 2257  BP: 112/71 120/75  Pulse: (!) 113 (!) 110  Resp: 20 20  Temp:  97.5 F (36.4 C)   Vitals:   02/12/17 2257 02/13/17 0111 02/13/17 0752 02/13/17 1001  BP: 120/75     Pulse: (!) 110     Resp: 20     Temp: 97.5 F (36.4 C)     TempSrc: Oral     SpO2: 92% 93% 90% 94%  Weight:      Height:       General: Pt is a morbidly obese Caucasian female who is alert, awake, not in acute distress Cardiovascular:  Tachycardic Rate and Regular Rhythm, S1/S2 +, no rubs, no gallops Respiratory: Diminished bilaterally with some expiratory wheezing Right worse than Left, no rhonchi Abdominal: Soft, NT, Distended due to body habitus, bowel sounds + Extremities: no edema, no cyanosis  The results of significant diagnostics from this hospitalization (including imaging, microbiology, ancillary and laboratory) are listed below for reference.    Microbiology: Recent Results (from the past 240 hour(s))  Respiratory Panel by PCR     Status: Abnormal   Collection Time: 02/12/17  1:24 PM  Result Value Ref Range Status   Adenovirus  NOT DETECTED NOT DETECTED Final   Coronavirus 229E NOT DETECTED NOT DETECTED Final   Coronavirus HKU1 NOT DETECTED NOT DETECTED Final   Coronavirus NL63 NOT DETECTED NOT DETECTED Final   Coronavirus OC43 NOT DETECTED NOT DETECTED Final   Metapneumovirus NOT DETECTED NOT DETECTED Final   Rhinovirus / Enterovirus DETECTED (A) NOT DETECTED Final   Influenza A NOT DETECTED NOT DETECTED Final   Influenza B NOT DETECTED NOT DETECTED Final   Parainfluenza Virus 1 NOT DETECTED NOT DETECTED Final   Parainfluenza Virus 2 NOT DETECTED NOT DETECTED Final   Parainfluenza Virus 3 NOT DETECTED NOT DETECTED Final   Parainfluenza Virus 4 NOT DETECTED NOT DETECTED Final   Respiratory Syncytial Virus NOT DETECTED NOT DETECTED Final   Bordetella pertussis NOT DETECTED NOT DETECTED Final   Chlamydophila pneumoniae NOT DETECTED NOT DETECTED Final   Mycoplasma pneumoniae NOT DETECTED NOT DETECTED Final    Comment: Performed at [facility], [address], [place], [place] [address]  Culture, sputum-assessment     Status: None (Preliminary result)   Collection Time: 02/12/17  4:28 PM  Result Value Ref Range Status   Specimen Description EXPECTORATED SPUTUM  Final   Special Requests NONE  Final   Sputum evaluation   Final    THIS SPECIMEN IS ACCEPTABLE FOR SPUTUM CULTURE Performed at [facility]    Report Status PENDING  Incomplete  Culture, respiratory (NON-Expectorated)     Status: None (Preliminary result)   Collection Time: 02/12/17  4:28 PM  Result Value Ref Range Status   Specimen Description EXPECTORATED SPUTUM  Final   Special Requests NONE Reflexed from [id number]  Final   Gram Stain   Final    ABUNDANT WBC PRESENT, PREDOMINANTLY PMN FEW SQUAMOUS EPITHELIAL CELLS PRESENT ABUNDANT GRAM POSITIVE COCCI IN PAIRS MODERATE GRAM POSITIVE RODS FEW GRAM NEGATIVE RODS Performed at [facility], [address], [place], [place] [address]    Culture PENDING  Incomplete   Report Status PENDING  Incomplete    Labs: BNP (last 3 results) No results for input(s): BNP in the last 8760 hours. Basic Metabolic Panel:  Recent Labs Lab 02/12/17 1252 02/13/17 0221  NA 137 137  K 3.7 4.0  CL 102 106  CO2  --  23  GLUCOSE 120* 152*  BUN 4* 6  CREATININE 0.60 0.61  CALCIUM  --  9.2   Liver Function Tests:  Recent Labs Lab 02/13/17 0221  AST 17  ALT 17  ALKPHOS 66  BILITOT 0.6  PROT 7.7  ALBUMIN 4.1   No results for input(s): LIPASE, AMYLASE in the last 168 hours. No results for input(s): AMMONIA in the last 168 hours. CBC:  Recent Labs Lab 02/12/17 1240 02/12/17 1252 02/13/17 0221  WBC 15.5*  --  14.0*  HGB 14.2 14.6 13.8  HCT 41.8 43.0 41.1  MCV 83.9  --  84.4  PLT 395  --  325   Cardiac Enzymes:  Recent Labs Lab 02/12/17 1326 02/12/17 2046 02/13/17 0221  TROPONINI <0.03 <0.03 <0.03   BNP: Invalid input(s): POCBNP CBG: No results for input(s): GLUCAP in the last 168 hours. D-Dimer  Recent Labs  02/12/17 1326  DDIMER <0.27   Hgb A1c  Recent Labs  02/12/17 1240  HGBA1C 5.3   Lipid Profile No results for input(s): CHOL, HDL, LDLCALC, TRIG, CHOLHDL, LDLDIRECT in the last 72 hours. Thyroid function studies No results for input(s): TSH, T4TOTAL, T3FREE, THYROIDAB in the last 72 hours.  Invalid input(s): FREET3 Anemia work  up No results for input(s): VITAMINB12, FOLATE, FERRITIN, TIBC, IRON, RETICCTPCT in the last 72 hours. Urinalysis    Component Value Date/Time   COLORURINE YELLOW 05/22/2012 1501   APPEARANCEUR HAZY (A) 05/22/2012 1501   LABSPEC >1.030 (H) 05/22/2012 1501   PHURINE 5.5 05/22/2012 1501   GLUCOSEU NEGATIVE 05/22/2012 1501   HGBUR NEGATIVE 05/22/2012 1501   BILIRUBINUR NEGATIVE 05/22/2012 1501   KETONESUR NEGATIVE 05/22/2012 1501   PROTEINUR NEGATIVE 05/22/2012 1501   UROBILINOGEN 0.2 05/22/2012 1501   NITRITE POSITIVE (A) 05/22/2012 1501   LEUKOCYTESUR NEGATIVE 05/22/2012 1501   Sepsis Labs Invalid input(s): PROCALCITONIN,  WBC,  LACTICIDVEN Microbiology Recent Results (from the past 240 hour(s))  Respiratory Panel by PCR     Status: Abnormal   Collection Time: 02/12/17  1:24 PM  Result Value Ref Range Status   Adenovirus NOT DETECTED NOT DETECTED Final   Coronavirus 229E NOT DETECTED NOT DETECTED Final   Coronavirus HKU1 NOT DETECTED NOT DETECTED Final   Coronavirus NL63 NOT DETECTED NOT DETECTED Final   Coronavirus OC43 NOT DETECTED NOT DETECTED Final   Metapneumovirus NOT DETECTED NOT DETECTED Final   Rhinovirus / Enterovirus DETECTED (A) NOT DETECTED Final   Influenza A NOT DETECTED NOT DETECTED Final   Influenza B NOT DETECTED NOT DETECTED Final   Parainfluenza Virus 1 NOT DETECTED NOT DETECTED Final   Parainfluenza Virus 2 NOT DETECTED NOT DETECTED Final   Parainfluenza Virus 3 NOT DETECTED NOT DETECTED Final   Parainfluenza Virus 4 NOT DETECTED NOT DETECTED Final   Respiratory Syncytial Virus NOT DETECTED NOT DETECTED Final   Bordetella pertussis NOT DETECTED NOT DETECTED Final   Chlamydophila pneumoniae NOT DETECTED NOT DETECTED Final   Mycoplasma pneumoniae NOT DETECTED NOT DETECTED Final    Comment: Performed at [facility], [address] [place], [place] [address]  Culture, sputum-assessment     Status: None (Preliminary result)   Collection Time:  02/12/17  4:28 PM  Result Value Ref Range Status   Specimen Description EXPECTORATED SPUTUM  Final   Special Requests NONE  Final   Sputum evaluation   Final    THIS SPECIMEN IS ACCEPTABLE FOR SPUTUM CULTURE Performed at [facility]    Report Status PENDING  Incomplete  Culture, respiratory (NON-Expectorated)     Status: None (Preliminary result)   Collection Time: 02/12/17  4:28 PM  Result Value Ref Range Status   Specimen Description EXPECTORATED SPUTUM  Final   Special Requests NONE Reflexed from [id number]  Final   Gram Stain   Final    ABUNDANT WBC PRESENT, PREDOMINANTLY PMN FEW SQUAMOUS EPITHELIAL CELLS PRESENT ABUNDANT GRAM POSITIVE COCCI IN PAIRS MODERATE GRAM POSITIVE RODS FEW GRAM NEGATIVE RODS Performed at [facility], [address] [place], [place] [address]    Culture PENDING  Incomplete   Report Status PENDING  Incomplete   Time coordinating discharge: 35 minutes  SIGNED:  Merlene Laughter, DO [facility] 02/13/2017, 12:49 PM Pager [phone]  If 7PM-7AM, please contact night-coverage [email or web address] Password [email or web address]

## 2017-02-14 LAB — EXPECTORATED SPUTUM ASSESSMENT W REFEX TO RESP CULTURE

## 2017-02-14 LAB — EXPECTORATED SPUTUM ASSESSMENT W GRAM STAIN, RFLX TO RESP C

## 2017-02-15 LAB — CULTURE, RESPIRATORY

## 2017-02-15 LAB — CULTURE, RESPIRATORY W GRAM STAIN: Culture: NORMAL

## 2020-04-13 ENCOUNTER — Other Ambulatory Visit: Payer: Self-pay

## 2020-04-13 ENCOUNTER — Emergency Department (HOSPITAL_COMMUNITY): Payer: Self-pay

## 2020-04-13 ENCOUNTER — Encounter (HOSPITAL_COMMUNITY): Payer: Self-pay

## 2020-04-13 ENCOUNTER — Emergency Department (HOSPITAL_COMMUNITY)
Admission: EM | Admit: 2020-04-13 | Discharge: 2020-04-13 | Disposition: A | Discharge status: Home / Self Care | Payer: Self-pay | Attending: Emergency Medicine | Admitting: Emergency Medicine

## 2020-04-13 DIAGNOSIS — J40 Bronchitis, not specified as acute or chronic: Secondary | ICD-10-CM

## 2020-04-13 DIAGNOSIS — J209 Acute bronchitis, unspecified: Secondary | ICD-10-CM | POA: Insufficient documentation

## 2020-04-13 DIAGNOSIS — F17210 Nicotine dependence, cigarettes, uncomplicated: Secondary | ICD-10-CM | POA: Insufficient documentation

## 2020-04-13 DIAGNOSIS — R059 Cough, unspecified: Secondary | ICD-10-CM

## 2020-04-13 MED ORDER — BENZONATATE 100 MG PO CAPS
100.0000 mg | ORAL_CAPSULE | Freq: Once | ORAL | Status: AC
  Administered 2020-04-13: 15:00:00 100 mg via ORAL
  Filled 2020-04-13: qty 1

## 2020-04-13 MED ORDER — BENZONATATE 100 MG PO CAPS: 100.0000 mg | ORAL_CAPSULE | Freq: Three times a day (TID) | ORAL | 0 refills | Status: DC

## 2020-04-13 MED ORDER — PREDNISONE 50 MG PO TABS
60.0000 mg | ORAL_TABLET | Freq: Once | ORAL | Status: AC
  Administered 2020-04-13: 13:00:00 60 mg via ORAL
  Filled 2020-04-13: qty 1

## 2020-04-13 MED ORDER — PREDNISONE 20 MG PO TABS: 20.0000 mg | ORAL_TABLET | Freq: Every day | ORAL | 0 refills | Status: DC

## 2020-04-13 MED ORDER — ALBUTEROL SULFATE HFA 108 (90 BASE) MCG/ACT IN AERS
1.0000 | INHALATION_SPRAY | RESPIRATORY_TRACT | Status: DC | PRN
  Administered 2020-04-13: 2 via RESPIRATORY_TRACT
  Filled 2020-04-13: qty 6.7

## 2020-04-13 MED ORDER — ALBUTEROL SULFATE HFA 108 (90 BASE) MCG/ACT IN AERS
8.0000 | INHALATION_SPRAY | Freq: Once | RESPIRATORY_TRACT | Status: AC
  Administered 2020-04-13: 11:00:00 8 via RESPIRATORY_TRACT
  Filled 2020-04-13: qty 6.7

## 2020-04-13 NOTE — Discharge Instructions (Signed)
Continue to use inhaler as needed for shortness of breath and wheezing Take Prednisone for the next 5 days. You can start this medicine tomorrow Take Tessalon as needed for cough every 6-8 hours Please return if you are worsening

## 2020-04-13 NOTE — Clinical Social Work Note (Signed)
Transition of Care [facility]) - Emergency Department Mini Assessment  Patient Details  Name: FRANCHESKA VILLEDA MRN: [id number] Date of Birth: [birth year]  Transition of [facility]) CM/SW Contact:    Ewing Schlein, LCSW Phone Number: 04/13/2020, 12:28 PM  Clinical Narrative: Patient is a 24 year old female who presented to the ED for a cough and chest tightness. CSW reviewed chart and patient is currently uninsured and does not have a PCP. CSW called patient to discuss referrals as patient is on contact precautions and patient is agreeable to referrals for [facility] for PCP needs and the financial counselor to screen her for potential Medicaid. CSW called [facility] to make referral. CSW emailed financial counselor to make referral for screening. TOC signing off.  ED Mini Assessment: What brought you to the Emergency Department? : Cough, chest tightness Barriers to Discharge: ED Barriers Resolved Barrier interventions: Referrals to [facility] and financial counselor Means of departure: Car Interventions which prevented an admission or readmission: Other (must enter comment) (Referral to [facility]; referral to financial counselor)  Patient Contact and Communications Key Contact 1: [facility] Key Contact 2: [place] Royalty (financial counselor) Contact Date: 04/13/20   Admission diagnosis:  COUGH,CHEST TIGHTNESS Patient Active Problem List   Diagnosis Date Noted   Bronchitis 02/12/2017   PCP:  Patient, No Pcp Per Pharmacy:   [id number] - [place], [place] - [address] [place] [address] Phone: [phone] Fax: [phone]

## 2020-04-13 NOTE — ED Provider Notes (Signed)
[facility] EMERGENCY DEPARTMENT Provider Note   CSN: [id number] Arrival date & time: 04/13/20  1696     History Chief Complaint  Patient presents with  . Cough    Patricia Stokes is a 24 y.o. female who presents with SOB, cough, wheezing. Pt states that for the past 2 days she's had progressively worsening SOB, dry cough, and wheezing. She reports associated central chest tightness that is worse with exertion. She is a smoker and smokes 2 ppd. She has albuterol but ran out of this yesterday so decided to come to the ED today. Symptoms feels somewhat like when she had pneumonia a couple years ago and had to be admitted. She denies fever, chills, URI symptoms, abdominal pain, N/V/D. No recent surgery/travel/immobilization, hx of cancer, leg swelling, hemoptysis, prior DVT/PE, or hormone use.   HPI     History reviewed. No pertinent past medical history.  Patient Active Problem List   Diagnosis Date Noted  . Bronchitis 02/12/2017    Past Surgical History:  Procedure Laterality Date  . TONSILLECTOMY       OB History   No obstetric history on file.     No family history on file.  Social History   Tobacco Use  . Smoking status: Current Every Day Smoker    Packs/day: 0.50    Types: Cigarettes  . Smokeless tobacco: Never Used  Substance Use Topics  . Alcohol use: No  . Drug use: No    Home Medications Prior to Admission medications   Medication Sig Start Date End Date Taking? Authorizing Provider  albuterol (PROVENTIL HFA;VENTOLIN HFA) 108 (90 Base) MCG/ACT inhaler Inhale 2 puffs into the lungs every 2 (two) hours as needed for wheezing or shortness of breath (or coughing). 02/13/17   Sheikh, Omair Latif, DO  dextromethorphan-guaiFENesin [facility] DM) 30-600 MG 12hr tablet Take 1 tablet by mouth 2 (two) times daily. 02/13/17   Sheikh, Omair Latif, DO  levofloxacin (LEVAQUIN) 750 MG tablet Take 1 tablet (750 mg total) by mouth daily. 02/13/17   Sheikh, Omair Latif, DO    nicotine (NICODERM CQ - DOSED IN MG/24 HOURS) 21 mg/24hr patch Place 1 patch (21 mg total) onto the skin daily. 02/14/17   Sheikh, Omair Latif, DO  predniSONE (STERAPRED UNI-PAK 21 TAB) 10 MG (21) TBPK tablet Take 6 Tablets Day 1, 5 Tablets Day 2, 4 Tablets Day 3, 3 Tablets Day 4, 2 Tablets Day 5, 1 Tablet Day 6 and then Stop Day 7 02/13/17   Marguerita Merles Latif, DO  diphenhydrAMINE (BENADRYL) 25 MG tablet Take at bedtime prn itching. 01/01/15 01/22/15  Janne Napoleon, NP  loratadine (CLARITIN) 10 MG tablet Take 1 tablet (10 mg total) by mouth daily. 01/01/15 01/22/15  Janne Napoleon, NP    Allergies    Amoxicillin  Review of Systems   Review of Systems  Constitutional: Negative for chills and fever.  HENT: Negative for rhinorrhea and sore throat.   Respiratory: Positive for cough, chest tightness, shortness of breath and wheezing.   Cardiovascular: Negative for chest pain and leg swelling.  All other systems reviewed and are negative.   Physical Exam Updated Vital Signs BP 102/67 (BP Location: Right Arm)   Pulse 85   Temp 98.4 F (36.9 C) (Oral)   Ht 5' (1.524 m)   Wt 113.4 kg   LMP 03/23/2020 (Approximate)   SpO2 96%   BMI 48.82 kg/m   Physical Exam Vitals and nursing note reviewed.  Constitutional:  General: She is not in acute distress.    Appearance: She is well-developed. She is obese. She is not ill-appearing.  HENT:     Head: Normocephalic and atraumatic.     Right Ear: Tympanic membrane normal.     Left Ear: Tympanic membrane normal.     Nose: Rhinorrhea present.     Mouth/Throat:     Mouth: Mucous membranes are moist.  Eyes:     General: No scleral icterus.       Right eye: No discharge.        Left eye: No discharge.     Conjunctiva/sclera: Conjunctivae normal.     Pupils: Pupils are equal, round, and reactive to light.  Cardiovascular:     Rate and Rhythm: Normal rate and regular rhythm.  Pulmonary:     Effort: Pulmonary effort is normal. No respiratory  distress.     Breath sounds: Wheezing (diffuse expiratory wheezing) present.  Abdominal:     General: There is no distension.  Musculoskeletal:     Cervical back: Normal range of motion.  Skin:    General: Skin is warm and dry.  Neurological:     Mental Status: She is alert and oriented to person, place, and time.  Psychiatric:        Behavior: Behavior normal.     ED Results / Procedures / Treatments   Labs (all labs ordered are listed, but only abnormal results are displayed) Labs Reviewed - No data to display  EKG EKG Interpretation  Date/Time:  Monday April 13 2020 09:31:50 EDT Ventricular Rate:  87 PR Interval:  140 QRS Duration: 78 QT Interval:  348 QTC Calculation: 418 R Axis:   101 Text Interpretation: Normal sinus rhythm Rightward axis Borderline ECG No STEMI Confirmed by Alvester Chou [phone]) on 04/13/2020 9:36:38 AM   Radiology DG Chest Port 1 View  Result Date: 04/13/2020 CLINICAL DATA:  Cough. Additional history provided: Patient reports cough and shortness of breath since yesterday, chest heaviness. EXAM: PORTABLE CHEST 1 VIEW COMPARISON:  CT angiogram chest 02/12/2017, chest radiograph 02/12/2017 FINDINGS: Heart size within normal limits. There is no appreciable airspace consolidation. No evidence of pleural effusion or pneumothorax. No acute bony abnormality identified. IMPRESSION: No evidence of acute cardiopulmonary abnormality. Electronically Signed   By: Jackey Loge DO   On: 04/13/2020 10:58    Procedures Procedures (including critical care time)  Medications Ordered in ED Medications - No data to display  ED Course  I have reviewed the triage vital signs and the nursing notes.  Pertinent labs & imaging results that were available during my care of the patient were reviewed by me and considered in my medical decision making (see chart for details).  24 year old female presents with cough, chest tightness, wheezing and shortness of breath for  several days.  She has diffuse expiratory wheezes on exam.  She is a heavy smoker.  EKG is normal sinus rhythm.  Chest x-ray was obtained which is negative.  Patient had several rounds of an albuterol inhaler here and on repeat exam her lung sounds were improved.  She was also given a dose of prednisone and she states that she feels very shaky afterwards which could be related to the albuterol treatment as well.  Patient was ambulated and was mildly hypoxic to 88% but quickly rebounded with rest.  Overall she is feeling much better after treatment here.  Will attempt outpatient treatment with albuterol and a lower dose of steroids and she is not  tolerating 60 mg of prednisone.  Also give her prescription cough medicine.  Advised return if worsening.  MDM Rules/Calculators/A&P                           Final Clinical Impression(s) / ED Diagnoses Final diagnoses:  Bronchitis    Rx / DC Orders ED Discharge Orders    None       Bethel Born, PA-C 04/13/20 1642    Terald Sleeper, MD 04/13/20 [phone]

## 2020-04-13 NOTE — ED Notes (Signed)
Pulse oxygen 88% when ambulating

## 2020-04-13 NOTE — ED Triage Notes (Signed)
Pt reports cough and sob since yesterday.  Reports chest heaviness.

## 2020-04-15 ENCOUNTER — Other Ambulatory Visit: Payer: Self-pay

## 2020-04-15 ENCOUNTER — Emergency Department (HOSPITAL_COMMUNITY): Payer: Self-pay

## 2020-04-15 ENCOUNTER — Observation Stay (HOSPITAL_COMMUNITY)
Admission: EM | Admit: 2020-04-15 | Discharge: 2020-04-17 | Disposition: A | Discharge status: Home / Self Care | Payer: Self-pay | Attending: Internal Medicine | Admitting: Internal Medicine

## 2020-04-15 ENCOUNTER — Encounter (HOSPITAL_COMMUNITY): Payer: Self-pay | Admitting: *Deleted

## 2020-04-15 DIAGNOSIS — F17210 Nicotine dependence, cigarettes, uncomplicated: Secondary | ICD-10-CM | POA: Insufficient documentation

## 2020-04-15 DIAGNOSIS — J40 Bronchitis, not specified as acute or chronic: Secondary | ICD-10-CM

## 2020-04-15 DIAGNOSIS — B974 Respiratory syncytial virus as the cause of diseases classified elsewhere: Secondary | ICD-10-CM | POA: Insufficient documentation

## 2020-04-15 DIAGNOSIS — Z6841 Body Mass Index (BMI) 40.0 and over, adult: Secondary | ICD-10-CM | POA: Insufficient documentation

## 2020-04-15 DIAGNOSIS — J9601 Acute respiratory failure with hypoxia: Secondary | ICD-10-CM | POA: Insufficient documentation

## 2020-04-15 DIAGNOSIS — Z79899 Other long term (current) drug therapy: Secondary | ICD-10-CM | POA: Insufficient documentation

## 2020-04-15 DIAGNOSIS — Z7952 Long term (current) use of systemic steroids: Secondary | ICD-10-CM | POA: Insufficient documentation

## 2020-04-15 DIAGNOSIS — Z20822 Contact with and (suspected) exposure to covid-19: Secondary | ICD-10-CM | POA: Insufficient documentation

## 2020-04-15 DIAGNOSIS — F1290 Cannabis use, unspecified, uncomplicated: Secondary | ICD-10-CM | POA: Insufficient documentation

## 2020-04-15 DIAGNOSIS — J205 Acute bronchitis due to respiratory syncytial virus: Principal | ICD-10-CM | POA: Insufficient documentation

## 2020-04-15 LAB — TROPONIN I (HIGH SENSITIVITY): Troponin I (High Sensitivity): 3 ng/L (ref <18)

## 2020-04-15 LAB — CBC WITH DIFFERENTIAL/PLATELET
Abs Immature Granulocytes: 0.04 10*3/uL (ref 0.00–0.07)
Basophils Absolute: 0 10*3/uL (ref 0.0–0.1)
Basophils Relative: 0 %
Eosinophils Absolute: 0 10*3/uL (ref 0.0–0.5)
Eosinophils Relative: 0 %
HCT: 41.8 % (ref 36.0–46.0)
Hemoglobin: 13.4 g/dL (ref 12.0–15.0)
Immature Granulocytes: 0 %
Lymphocytes Relative: 12 %
Lymphs Abs: 1.3 10*3/uL (ref 0.7–4.0)
MCH: 28.5 pg (ref 26.0–34.0)
MCHC: 32.1 g/dL (ref 30.0–36.0)
MCV: 88.9 fL (ref 80.0–100.0)
Monocytes Absolute: 0.5 10*3/uL (ref 0.1–1.0)
Monocytes Relative: 5 %
Neutro Abs: 8.9 10*3/uL — ABNORMAL HIGH (ref 1.7–7.7)
Neutrophils Relative %: 83 %
Platelets: 373 10*3/uL (ref 150–400)
RBC: 4.7 MIL/uL (ref 3.87–5.11)
RDW: 13.2 % (ref 11.5–15.5)
WBC: 10.7 10*3/uL — ABNORMAL HIGH (ref 4.0–10.5)
nRBC: 0 % (ref 0.0–0.2)

## 2020-04-15 LAB — SARS CORONAVIRUS 2 BY RT PCR (HOSPITAL ORDER, PERFORMED IN ~~LOC~~ HOSPITAL LAB): SARS Coronavirus 2: NEGATIVE

## 2020-04-15 LAB — BASIC METABOLIC PANEL
Anion gap: 12 (ref 5–15)
BUN: 6 mg/dL (ref 6–20)
CO2: 21 mmol/L — ABNORMAL LOW (ref 22–32)
Calcium: 8.5 mg/dL — ABNORMAL LOW (ref 8.9–10.3)
Chloride: 105 mmol/L (ref 98–111)
Creatinine, Ser: 0.65 mg/dL (ref 0.44–1.00)
GFR calc Af Amer: >60 mL/min (ref >60)
GFR calc non Af Amer: >60 mL/min (ref >60)
Glucose, Bld: 121 mg/dL — ABNORMAL HIGH (ref 70–99)
Potassium: 3.6 mmol/L (ref 3.5–5.1)
Sodium: 138 mmol/L (ref 135–145)

## 2020-04-15 LAB — RAPID URINE DRUG SCREEN, HOSP PERFORMED
Amphetamines: NOT DETECTED
Barbiturates: NOT DETECTED
Benzodiazepines: NOT DETECTED
Cocaine: NOT DETECTED
Opiates: NOT DETECTED
Tetrahydrocannabinol: POSITIVE — AB

## 2020-04-15 LAB — D-DIMER, QUANTITATIVE: D-Dimer, Quant: <0.27 ug/mL-FEU (ref 0.00–0.50)

## 2020-04-15 LAB — POC URINE PREG, ED: Preg Test, Ur: NEGATIVE

## 2020-04-15 MED ORDER — IPRATROPIUM-ALBUTEROL 0.5-2.5 (3) MG/3ML IN SOLN
3.0000 mL | Freq: Once | RESPIRATORY_TRACT | Status: AC
  Administered 2020-04-15: 3 mL via RESPIRATORY_TRACT
  Filled 2020-04-15: qty 3

## 2020-04-15 MED ORDER — AEROCHAMBER PLUS FLO-VU MISC
1.0000 | Freq: Once | Status: AC
  Administered 2020-04-15: 1
  Filled 2020-04-15: qty 1

## 2020-04-15 MED ORDER — ACETAMINOPHEN 650 MG RE SUPP: 650.0000 mg | Freq: Four times a day (QID) | RECTAL | Status: DC | PRN

## 2020-04-15 MED ORDER — ONDANSETRON HCL 4 MG/2ML IJ SOLN: 4.0000 mg | Freq: Four times a day (QID) | INTRAMUSCULAR | Status: DC | PRN

## 2020-04-15 MED ORDER — POLYETHYLENE GLYCOL 3350 17 G PO PACK: 17.0000 g | PACK | Freq: Every day | ORAL | Status: DC | PRN

## 2020-04-15 MED ORDER — ALBUTEROL SULFATE (2.5 MG/3ML) 0.083% IN NEBU: 2.5000 mg | INHALATION_SOLUTION | RESPIRATORY_TRACT | Status: DC | PRN

## 2020-04-15 MED ORDER — NICOTINE 14 MG/24HR TD PT24
14.0000 mg | MEDICATED_PATCH | Freq: Every day | TRANSDERMAL | Status: DC
  Filled 2020-04-15: qty 1

## 2020-04-15 MED ORDER — METHYLPREDNISOLONE SODIUM SUCC 40 MG IJ SOLR
125.0000 mg | Freq: Once | INTRAMUSCULAR | Status: AC
  Administered 2020-04-15: 125 mg via INTRAVENOUS
  Filled 2020-04-15: qty 4

## 2020-04-15 MED ORDER — GUAIFENESIN-DM 100-10 MG/5ML PO SYRP
5.0000 mL | ORAL_SOLUTION | Freq: Four times a day (QID) | ORAL | Status: AC
  Administered 2020-04-15 – 2020-04-16 (×5): 5 mL via ORAL
  Filled 2020-04-15 (×5): qty 5

## 2020-04-15 MED ORDER — METHYLPREDNISOLONE SODIUM SUCC 125 MG IJ SOLR
60.0000 mg | Freq: Two times a day (BID) | INTRAMUSCULAR | Status: DC
  Administered 2020-04-16 – 2020-04-17 (×3): 60 mg via INTRAVENOUS
  Filled 2020-04-15 (×3): qty 2

## 2020-04-15 MED ORDER — ALBUTEROL SULFATE HFA 108 (90 BASE) MCG/ACT IN AERS
4.0000 | INHALATION_SPRAY | Freq: Once | RESPIRATORY_TRACT | Status: AC
  Administered 2020-04-15: 4 via RESPIRATORY_TRACT
  Filled 2020-04-15: qty 6.7

## 2020-04-15 MED ORDER — ACETAMINOPHEN 325 MG PO TABS: 650.0000 mg | ORAL_TABLET | Freq: Four times a day (QID) | ORAL | Status: DC | PRN

## 2020-04-15 MED ORDER — ALBUTEROL SULFATE (2.5 MG/3ML) 0.083% IN NEBU
2.5000 mg | INHALATION_SOLUTION | Freq: Four times a day (QID) | RESPIRATORY_TRACT | Status: DC
  Administered 2020-04-16 (×2): 2.5 mg via RESPIRATORY_TRACT
  Filled 2020-04-15: qty 3

## 2020-04-15 MED ORDER — ONDANSETRON HCL 4 MG PO TABS: 4.0000 mg | ORAL_TABLET | Freq: Four times a day (QID) | ORAL | Status: DC | PRN

## 2020-04-15 MED ORDER — ENOXAPARIN SODIUM 60 MG/0.6ML ~~LOC~~ SOLN
50.0000 mg | SUBCUTANEOUS | Status: DC
  Administered 2020-04-15 – 2020-04-16 (×2): 50 mg via SUBCUTANEOUS
  Filled 2020-04-15 (×2): qty 0.6

## 2020-04-15 NOTE — ED Notes (Signed)
Respiratory contacted for nebulizer treatment. ° °

## 2020-04-15 NOTE — ED Notes (Signed)
Patient drops to 88 O2 saturation with ambulation.

## 2020-04-15 NOTE — ED Triage Notes (Signed)
Pt was at ED 2 days ago and was diagnosed with bronchitis. Pt has been using her inhalers but reports her SOB is getting worse.

## 2020-04-15 NOTE — H&P (Addendum)
History and Physical    Patricia Stokes:[id number] DOB: 1995-11-22 DOA: 04/15/2020  PCP: Patient, No Pcp Per   Patient coming from: Home  I have personally briefly reviewed patient's old medical records in [facility]  Chief Complaint: Difficulty breathing, productive cough  HPI: Patricia Stokes is a 24 y.o. female with medical history significant for bronchitis, morbid obesity.  Patient presented to the ED with complaints of difficulty breathing and productive cough, ongoing for the past 4 days and getting worse.   Patient was in the ED 2 days ago with similar complaints, also including cough and wheezing, she was diagnosed with bronchitis, discharged home with prednisone albuterol inhaler and Tessalon, despite compliance with treatment, symptoms have persisted, are milder when she is at rest, but increases with activity. No chest pains, lower extremity swelling.  No family history of asthma.  She still smokes 2 packs of cigarettes daily.  ED Course: O2 sats 92 to 97% on room air, but with ambulation patient's O2 sats dropped to 88%.  WBC 10.7.  D-dimer and troponin x 1 unremarkable.  Portable chest x-ray without acute abnormality.  Covid test negative.  DuoNebs, Solu-Medrol given in ED, hospitalist to admit for acute respiratory failure secondary to bronchitis.  Review of Systems: As per HPI all other systems reviewed and negative.  History reviewed. No pertinent past medical history.  Past Surgical History:  Procedure Laterality Date  . TONSILLECTOMY       reports that she has been smoking cigarettes. She has been smoking about 0.50 packs per day. She has never used smokeless tobacco. She reports that she does not drink alcohol and does not use drugs.  Allergies  Allergen Reactions  . Amoxicillin Nausea And Vomiting and Rash   No family history of asthma.  Prior to Admission medications   Medication Sig Start Date End Date Taking? Authorizing Provider  albuterol  (PROVENTIL HFA;VENTOLIN HFA) 108 (90 Base) MCG/ACT inhaler Inhale 2 puffs into the lungs every 2 (two) hours as needed for wheezing or shortness of breath (or coughing). 02/13/17   Sheikh, Omair Latif, DO  benzonatate (TESSALON) 100 MG capsule Take 1 capsule (100 mg total) by mouth every 8 (eight) hours. 04/13/20   Bethel Born, PA-C  dextromethorphan-guaiFENesin (MUCINEX DM) 30-600 MG 12hr tablet Take 1 tablet by mouth 2 (two) times daily. 02/13/17   Sheikh, Omair Latif, DO  levofloxacin (LEVAQUIN) 750 MG tablet Take 1 tablet (750 mg total) by mouth daily. 02/13/17   Sheikh, Omair Latif, DO  nicotine (NICODERM CQ - DOSED IN MG/24 HOURS) 21 mg/24hr patch Place 1 patch (21 mg total) onto the skin daily. 02/14/17   Sheikh, Omair Latif, DO  predniSONE (DELTASONE) 20 MG tablet Take 1 tablet (20 mg total) by mouth daily. 04/13/20   Bethel Born, PA-C  diphenhydrAMINE (BENADRYL) 25 MG tablet Take at bedtime prn itching. 01/01/15 01/22/15  Janne Napoleon, NP  loratadine (CLARITIN) 10 MG tablet Take 1 tablet (10 mg total) by mouth daily. 01/01/15 01/22/15  Janne Napoleon, NP    Physical Exam: Vitals:   04/15/20 1521 04/15/20 1623 04/15/20 1728 04/15/20 1810  BP: (!) 131/57   136/76  Pulse: (!) 101   100  Resp: 18   20  Temp:      TempSrc:      SpO2: 92% 94% 97% 96%  Weight:      Height:        Constitutional: NAD, calm, comfortable Vitals:  04/15/20 1521 04/15/20 1623 04/15/20 1728 04/15/20 1810  BP: (!) 131/57   136/76  Pulse: (!) 101   100  Resp: 18   20  Temp:      TempSrc:      SpO2: 92% 94% 97% 96%  Weight:      Height:       Eyes: PERRL, lids and conjunctivae normal ENMT: Mucous membranes are moist. Posterior pharynx clear of any exudate or lesions.Normal dentition.  Neck: normal, supple, no masses, no thyromegaly Respiratory: Faint occasional expiratory wheezing, no crackles. Normal respiratory effort. No accessory muscle use.  Cardiovascular: Regular rate and rhythm, No extremity  edema. 2+ pedal pulses. Abdomen: no tenderness, no masses palpated. No hepatosplenomegaly. Bowel sounds positive.  Musculoskeletal: no clubbing / cyanosis. No joint deformity upper and lower extremities. Good ROM, no contractures. Normal muscle tone.  Skin: no rashes, lesions, ulcers. No induration Neurologic: No apparent cranial nerve abnormality, moving all extremities spontaneously. Psychiatric: Normal judgment and insight. Alert and oriented x 3. Normal mood.   Labs on Admission: I have personally reviewed following labs and imaging studies  CBC: Recent Labs  Lab 04/15/20 1600  WBC 10.7*  NEUTROABS 8.9*  HGB 13.4  HCT 41.8  MCV 88.9  PLT 373   Basic Metabolic Panel: Recent Labs  Lab 04/15/20 1600  NA 138  K 3.6  CL 105  CO2 21*  GLUCOSE 121*  BUN 6  CREATININE 0.65  CALCIUM 8.5*    Radiological Exams on Admission: DG Chest 2 View  Result Date: 04/15/2020 CLINICAL DATA:  Shortness of breath EXAM: CHEST - 2 VIEW COMPARISON:  04/13/2020 FINDINGS: The heart size and mediastinal contours are within normal limits. Both lungs are clear. No pleural effusion or pneumothorax. The visualized skeletal structures are unremarkable. IMPRESSION: No acute process in the chest. Electronically Signed   By: Guadlupe Spanish M.D.   On: 04/15/2020 13:27    EKG: Independently reviewed.  Sinus rhythm, rate 103, QTc 434.  No significant ST or T wave changes compared to prior EKG.  Assessment/Plan Principal Problem:   Bronchitis Active Problems:   Morbid obesity with BMI of 45.0-49.9, adult (HCC)  Acute bronchitis with acute hypoxic respiratory failure-dyspnea, productive cough, O2 sats down to 88% on room air with ambulation.  Covid test negative, portable chest x-ray unremarkable.  WBC 10.7, D-dimer, troponin EKG unremarkable also.  Patient continues to smoke.  Hospitalization in 2018 with bronchitis, but at that time in the setting of pneumonia also. -125 Solu-Medrol given, continuous 60  every 12 hourly -Albuterol nebulizers scheduled and as needed -Mucolytic scheduled -UDS - May need to follow-up with outpatient provider for PFTs. -Held off on antibiotics at this time -Daily fasting blood glucose while on steroids.  Tobacco abuse-smokes 2 pack of cigarettes daily. -I have extensively counseled patient on why she needs to quit smoking.  She voiced understanding. -Nicotine patch ordered  Morbid obesity-BMI of 48.  DVT prophylaxis: Lovenox Code Status: Full code Family Communication: None at bedside Disposition Plan: 1 to 2 days Consults called: None Admission status: Observation, MedSurg   Onnie Boer MD [facility]  04/15/2020, 6:52 PM

## 2020-04-15 NOTE — ED Provider Notes (Addendum)
[facility] EMERGENCY DEPARTMENT Provider Note   CSN: [id number] Arrival date & time: 04/15/20  1043     History Chief Complaint  Patient presents with  . Shortness of Breath    Patricia Stokes is a 24 y.o. female history of obesity, smoker otherwise healthy no daily medication use.  Patient presents today for cough, wheezing and shortness of breath.  Symptom onset 4 days ago, progressively worsening shortness of breath, nonproductive cough and wheezing.  She reports mild burning chest pain only when she coughs, nonradiating, improves when she stops coughing.  Patient reports she was seen in the ER 2 days ago, she was prescribed cough medicine, albuterol and prednisone which she has been taking as prescribed without improvement in her symptoms.    Patient reports that she has continued to smoke cigarettes but has "not inhaled them".  Denies fever/chills, headache, neck stiffness, sore throat, hemoptysis, abdominal pain, nausea/vomiting, diarrhea, extremity swelling/color change, history of blood clot, recent travel/immobilization, recent surgery, history of cancer or any additional concerns.  Of note patient does have Implanon.   HPI     History reviewed. No pertinent past medical history.  Patient Active Problem List   Diagnosis Date Noted  . Bronchitis 02/12/2017    Past Surgical History:  Procedure Laterality Date  . TONSILLECTOMY       OB History   No obstetric history on file.     No family history on file.  Social History   Tobacco Use  . Smoking status: Current Every Day Smoker    Packs/day: 0.50    Types: Cigarettes  . Smokeless tobacco: Never Used  Substance Use Topics  . Alcohol use: No  . Drug use: No    Home Medications Prior to Admission medications   Medication Sig Start Date End Date Taking? Authorizing Provider  albuterol (PROVENTIL HFA;VENTOLIN HFA) 108 (90 Base) MCG/ACT inhaler Inhale 2 puffs into the lungs every 2 (two) hours as needed for  wheezing or shortness of breath (or coughing). 02/13/17   Sheikh, Omair Latif, DO  benzonatate (TESSALON) 100 MG capsule Take 1 capsule (100 mg total) by mouth every 8 (eight) hours. 04/13/20   Bethel Born, PA-C  dextromethorphan-guaiFENesin (MUCINEX DM) 30-600 MG 12hr tablet Take 1 tablet by mouth 2 (two) times daily. 02/13/17   Sheikh, Omair Latif, DO  levofloxacin (LEVAQUIN) 750 MG tablet Take 1 tablet (750 mg total) by mouth daily. 02/13/17   Sheikh, Omair Latif, DO  nicotine (NICODERM CQ - DOSED IN MG/24 HOURS) 21 mg/24hr patch Place 1 patch (21 mg total) onto the skin daily. 02/14/17   Sheikh, Omair Latif, DO  predniSONE (DELTASONE) 20 MG tablet Take 1 tablet (20 mg total) by mouth daily. 04/13/20   Bethel Born, PA-C  diphenhydrAMINE (BENADRYL) 25 MG tablet Take at bedtime prn itching. 01/01/15 01/22/15  Janne Napoleon, NP  loratadine (CLARITIN) 10 MG tablet Take 1 tablet (10 mg total) by mouth daily. 01/01/15 01/22/15  Janne Napoleon, NP    Allergies    Amoxicillin  Review of Systems   Review of Systems Ten systems are reviewed and are negative for acute change except as noted in the HPI  Physical Exam Updated Vital Signs BP 136/76   Pulse 100   Temp 98.1 F (36.7 C) (Oral)   Resp 20   Ht 5' (1.524 m)   Wt 113.4 kg   LMP 03/23/2020 (Approximate)   SpO2 96%   BMI 48.82 kg/m   Physical Exam  Constitutional:      General: She is not in acute distress.    Appearance: Normal appearance. She is well-developed. She is not ill-appearing or diaphoretic.  HENT:     Head: Normocephalic and atraumatic.  Eyes:     General: Vision grossly intact. Gaze aligned appropriately.     Pupils: Pupils are equal, round, and reactive to light.  Neck:     Trachea: Trachea and phonation normal.  Cardiovascular:     Rate and Rhythm: Normal rate and regular rhythm.  Pulmonary:     Effort: Pulmonary effort is normal. No respiratory distress.     Breath sounds: Decreased breath sounds and wheezing  present.  Chest:     Chest wall: Tenderness present.  Abdominal:     General: There is no distension.     Palpations: Abdomen is soft.     Tenderness: There is no abdominal tenderness. There is no guarding or rebound.  Musculoskeletal:        General: Normal range of motion.     Cervical back: Normal range of motion.     Right lower leg: No tenderness. No edema.     Left lower leg: No tenderness. No edema.  Skin:    General: Skin is warm and dry.  Neurological:     Mental Status: She is alert.     GCS: GCS eye subscore is 4. GCS verbal subscore is 5. GCS motor subscore is 6.     Comments: Speech is clear and goal oriented, follows commands Major Cranial nerves without deficit, no facial droop Moves extremities without ataxia, coordination intact  Psychiatric:        Behavior: Behavior normal.     ED Results / Procedures / Treatments   Labs (all labs ordered are listed, but only abnormal results are displayed) Labs Reviewed  CBC WITH DIFFERENTIAL/PLATELET - Abnormal; Notable for the following components:      Result Value   WBC 10.7 (*)    Neutro Abs 8.9 (*)    All other components within normal limits  BASIC METABOLIC PANEL - Abnormal; Notable for the following components:   CO2 21 (*)    Glucose, Bld 121 (*)    Calcium 8.5 (*)    All other components within normal limits  SARS CORONAVIRUS 2 BY RT PCR (HOSPITAL ORDER, PERFORMED IN [facility])  D-DIMER, QUANTITATIVE (NOT AT [facility])  POC URINE PREG, ED  TROPONIN I (HIGH SENSITIVITY)    EKG EKG Interpretation  Date/Time:  Wednesday April 15 2020 15:12:07 EDT Ventricular Rate:  103 PR Interval:    QRS Duration: 85 QT Interval:  331 QTC Calculation: 434 R Axis:   126 Text Interpretation: Sinus tachycardia Right axis deviation Since last tracing rate faster Confirmed by Jacalyn Lefevre [phone]) on 04/15/2020 3:17:57 PM   Radiology DG Chest 2 View  Result Date: 04/15/2020 CLINICAL DATA:  Shortness of  breath EXAM: CHEST - 2 VIEW COMPARISON:  04/13/2020 FINDINGS: The heart size and mediastinal contours are within normal limits. Both lungs are clear. No pleural effusion or pneumothorax. The visualized skeletal structures are unremarkable. IMPRESSION: No acute process in the chest. Electronically Signed   By: Guadlupe Spanish M.D.   On: 04/15/2020 13:27    Procedures .Critical Care Performed by: Bill Salinas, PA-C Authorized by: Bill Salinas, PA-C   Critical care provider statement:    Critical care time (minutes):  35   Critical care was necessary to treat or prevent imminent or  life-threatening deterioration of the following conditions:  Respiratory failure   Critical care was time spent personally by me on the following activities:  Discussions with consultants, evaluation of patient's response to treatment, examination of patient, ordering and performing treatments and interventions, ordering and review of laboratory studies, ordering and review of radiographic studies, pulse oximetry, re-evaluation of patient's condition, obtaining history from patient or surrogate, review of old charts and development of treatment plan with patient or surrogate   (including critical care time)  Medications Ordered in ED Medications  methylPREDNISolone sodium succinate (SOLU-MEDROL) 40 mg/mL injection 125 mg (has no administration in time range)  ipratropium-albuterol (DUONEB) 0.5-2.5 (3) MG/3ML nebulizer solution 3 mL (has no administration in time range)  ipratropium-albuterol (DUONEB) 0.5-2.5 (3) MG/3ML nebulizer solution 3 mL (3 mLs Nebulization Given 04/15/20 1623)  albuterol (VENTOLIN HFA) 108 (90 Base) MCG/ACT inhaler 4 puff (4 puffs Inhalation Given 04/15/20 1517)  aerochamber plus with mask device 1 each (1 each Other Given 04/15/20 1517)  ipratropium-albuterol (DUONEB) 0.5-2.5 (3) MG/3ML nebulizer solution 3 mL (3 mLs Nebulization Given 04/15/20 1728)    ED Course  I have reviewed the  triage vital signs and the nursing notes.  Pertinent labs & imaging results that were available during my care of the patient were reviewed by me and considered in my medical decision making (see chart for details).  Clinical Course as of Apr 15 1816  Wed Apr 15, 2020  1645 Sinus tachycardia Right axis deviation Since last tracing rate faster Confirmed by Jacalyn Lefevre ([id number]) on 04/15/2020 3:17:57 PM   [BM]    Clinical Course User Index [BM] Sherion, Dooly was evaluated in Emergency Department on 04/15/2020 for the symptoms described in the history of present illness. She was evaluated in the context of the global COVID-19 pandemic, which necessitated consideration that the patient might be at risk for infection with the SARS-CoV-2 virus that causes COVID-19. Institutional protocols and algorithms that pertain to the evaluation of patients at risk for COVID-19 are in a state of rapid change based on information released by regulatory bodies including the CDC and federal and state organizations. These policies and algorithms were followed during the patient's care in the ED.  MDM Rules/Calculators/A&P                          Additional History Obtained: 1. Nursing notes from this visit. 2. Reviewed EMR, patient had ED visit on 04/13/2020.  She was diagnosed with bronchitis.  She was discharged with Tessalon, prednisone and albuterol.  Work-up included EKG and chest x-ray. --- On evaluation patient is well-appearing and in no acute distress.  Cranial nerves intact, airway clear, no meningismus.  Heart regular rate and rhythm.  Lungs with diminished movement and bilateral wheezing.  Chest wall tender to palpation without evidence of injury.  Abdomen soft nontender without peritoneal signs.  Neurovascular tact to all 4 extremities without evidence of DVT.  Vital signs stable on room air. - I ordered, reviewed and interpreted labs which include: High-sensitivity  troponin within normal limits, given 4 days of symptoms no negation for a delta troponin. D-dimer negative, doubt pulmonary embolism. CBC shows mild leukocytosis of 10.7, suspect this is from recent steroid use doubt acute bacterial infection at this time.  No evidence of anemia. BMP shows no emergent electrolyte derangement, evidence of acute kidney injury or anion gap. Pregnancy test negative. Covid test negative.  EKG reviewed  with Dr. Particia NearingHaviland and shows no acute ischemic changes. - Suspect patient symptoms due to reactive airway disease/bronchitis.  Doubt ACS, PE, dissection or other acute cardiopulmonary pathologies at this time.  Patient received multiple DuoNeb's in the ER.  On reassessment she is resting comfortably no acute distress but has continued bilateral wheezing some increased air movement though.  She was then ambulated with nursing staff and SPO2 dropped into the 80s.  Given patient has been receiving appropriate treatment as an outpatient but has continued worsening of symptoms feel that she will need admission to the hospital at this time.  Additional DuoNeb and Solu-Medrol has been ordered.  I then spoke with Dr. Mariea ClontsEmokpae and patient was accepted to hospitalist service.   Note: Portions of this report may have been transcribed using voice recognition software. Every effort was made to ensure accuracy; however, inadvertent computerized transcription errors may still be present. Final Clinical Impression(s) / ED Diagnoses Final diagnoses:  Acute hypoxemic respiratory failure [facility](HCC)    Rx / DC Orders ED Discharge Orders    None       Bill SalinasMorelli, Jamal Pavon A, PA-C 04/15/20 1843    Elizabeth PalauMorelli, Nani Ingram A, PA-C 04/15/20 1844    Jacalyn LefevreHaviland, Julie, MD 04/15/20 1902

## 2020-04-16 DIAGNOSIS — Z6841 Body Mass Index (BMI) 40.0 and over, adult: Secondary | ICD-10-CM

## 2020-04-16 DIAGNOSIS — J9601 Acute respiratory failure with hypoxia: Secondary | ICD-10-CM

## 2020-04-16 LAB — RESPIRATORY PANEL BY PCR

## 2020-04-16 LAB — GLUCOSE, CAPILLARY: Glucose-Capillary: 96 mg/dL (ref 70–99)

## 2020-04-16 LAB — HIV ANTIBODY (ROUTINE TESTING W REFLEX): HIV Screen 4th Generation wRfx: NONREACTIVE

## 2020-04-16 MED ORDER — BUDESONIDE 0.5 MG/2ML IN SUSP
0.5000 mg | Freq: Two times a day (BID) | RESPIRATORY_TRACT | Status: DC
  Administered 2020-04-16 – 2020-04-17 (×3): 0.5 mg via RESPIRATORY_TRACT
  Filled 2020-04-16 (×2): qty 2

## 2020-04-16 MED ORDER — NICOTINE 21 MG/24HR TD PT24
21.0000 mg | MEDICATED_PATCH | Freq: Every day | TRANSDERMAL | Status: DC
  Administered 2020-04-16 – 2020-04-17 (×2): 21 mg via TRANSDERMAL
  Filled 2020-04-16: qty 1

## 2020-04-16 MED ORDER — IPRATROPIUM-ALBUTEROL 0.5-2.5 (3) MG/3ML IN SOLN
3.0000 mL | Freq: Four times a day (QID) | RESPIRATORY_TRACT | Status: DC
  Administered 2020-04-16 – 2020-04-17 (×4): 3 mL via RESPIRATORY_TRACT
  Filled 2020-04-16 (×5): qty 3

## 2020-04-16 NOTE — Progress Notes (Signed)
PROGRESS NOTE  DOMINGUE COLTRAIN ZRA:[id number] DOB: 14-May-1996 DOA: 04/15/2020 PCP: Patient, No Pcp Per  Brief History:  24 year old female with a history of morbid obesity, tobacco abuse, and cannabis use presented with 3-day history of shortness of breath and coughing.  The patient presented to the emergency department on 04/13/2020 with similar symptoms.  She was sent home with prednisone, Tessalon Perles, and albuterol inhaler.  She stated that her shortness of breath and coughing persisted.  In fact, she states that she used her albuterol inhaler so much, she actually ran out.  She denied any fevers, chills, chest pain, nausea, vomiting, diarrhea, abdominal pain, dysuria, hematuria.  She continues to smoke up to 2 packs/day.  In the emergency department, the patient was afebrile hemodynamically stable.  Ambulation on room air showed desaturation down to 88%.  She continued to have shortness of breath with exertion.  As result the patient was admitted for further evaluation and treatment.  Assessment/Plan: Acute respiratory failure with hypoxia -Secondary to acute bronchitis -Viral respiratory panel -Patient continues to have shortness of breath with exertion -D-dimer negative -Continue IV Solu-Medrol -Continue duo nebs -Add Pulmicort -COVID-19 negative  Acute bronchitis -Viral respiratory panel -Treatment plan as discussed above  Tobacco abuse -Tobacco cessation discussed  Morbid obesity -BMI 50.85 -Lifestyle modification  Cannabis use -Cessation discussed    Status is: Observation  The patient will require care spanning > 2 midnights and should be moved to inpatient because: IV treatments appropriate due to intensity of illness or inability to take PO  Dispo: The patient is from: Home              Anticipated d/c is to: Home              Anticipated d/c date is: 1 day              Patient currently is not medically stable to d/c.        Family  Communication:  no Family at bedside  Consultants:  none  Code Status:  FULL   DVT Prophylaxis:  [place] Lovenox   Procedures: As Listed in Progress Note Above  Antibiotics: None      Subjective: Patient continues to have coughing and dyspnea on exertion.  She denies any nausea, vomiting, diarrhea, chest pain, fevers, chills, headache.  Objective: Vitals:   04/15/20 2200 04/15/20 2222 04/16/20 0021 04/16/20 0632  BP:  124/78  125/85  Pulse:  96  89  Resp:  20    Temp:  98.6 F (37 C)  97.7 F (36.5 C)  TempSrc:  Oral  Oral  SpO2:  95% 93% 92%  Weight: 118.1 kg     Height: 5' (1.524 m)      No intake or output data in the 24 hours ending 04/16/20 0816 Weight change:  Exam:   General:  Pt is alert, follows commands appropriately, not in acute distress  HEENT: No icterus, No thrush, No neck mass, [place]/AT  Cardiovascular: RRR, S1/S2, no rubs, no gallops  Respiratory: Bibasilar rales.  Bilateral expiratory wheeze.  Good air movement.  Abdomen: Soft/+BS, non tender, non distended, no guarding  Extremities: No edema, No lymphangitis, No petechiae, No rashes, no synovitis   Data Reviewed: I have personally reviewed following labs and imaging studies Basic Metabolic Panel: Recent Labs  Lab 04/15/20 1600  NA 138  K 3.6  CL 105  CO2 21*  GLUCOSE 121*  BUN 6  CREATININE 0.65  CALCIUM 8.5*   Liver Function Tests: No results for input(s): AST, ALT, ALKPHOS, BILITOT, PROT, ALBUMIN in the last 168 hours. No results for input(s): LIPASE, AMYLASE in the last 168 hours. No results for input(s): AMMONIA in the last 168 hours. Coagulation Profile: No results for input(s): INR, PROTIME in the last 168 hours. CBC: Recent Labs  Lab 04/15/20 1600  WBC 10.7*  NEUTROABS 8.9*  HGB 13.4  HCT 41.8  MCV 88.9  PLT 373   Cardiac Enzymes: No results for input(s): CKTOTAL, CKMB, CKMBINDEX, TROPONINI in the last 168 hours. BNP: Invalid input(s): POCBNP CBG: Recent Labs   Lab 04/16/20 0727  GLUCAP 96   HbA1C: No results for input(s): HGBA1C in the last 72 hours. Urine analysis:    Component Value Date/Time   COLORURINE YELLOW 05/22/2012 1501   APPEARANCEUR HAZY (A) 05/22/2012 1501   LABSPEC >1.030 (H) 05/22/2012 1501   PHURINE 5.5 05/22/2012 1501   GLUCOSEU NEGATIVE 05/22/2012 1501   HGBUR NEGATIVE 05/22/2012 1501   BILIRUBINUR NEGATIVE 05/22/2012 1501   KETONESUR NEGATIVE 05/22/2012 1501   PROTEINUR NEGATIVE 05/22/2012 1501   UROBILINOGEN 0.2 05/22/2012 1501   NITRITE POSITIVE (A) 05/22/2012 1501   LEUKOCYTESUR NEGATIVE 05/22/2012 1501   Sepsis Labs: @LABRCNTIP (procalcitonin:4,lacticidven:4) ) Recent Results (from the past 240 hour(s))  SARS Coronavirus 2 by RT PCR (hospital order, performed in [facility] hospital lab) Nasopharyngeal Nasopharyngeal Swab     Status: None   Collection Time: 04/15/20  2:42 PM   Specimen: Nasopharyngeal Swab  Result Value Ref Range Status   SARS Coronavirus 2 NEGATIVE NEGATIVE Final    Comment: (NOTE) SARS-CoV-2 target nucleic acids are NOT DETECTED.  The SARS-CoV-2 RNA is generally detectable in upper and lower respiratory specimens during the acute phase of infection. The lowest concentration of SARS-CoV-2 viral copies this assay can detect is 250 copies / mL. A negative result does not preclude SARS-CoV-2 infection and should not be used as the sole basis for treatment or other patient management decisions.  A negative result may occur with improper specimen collection / handling, submission of specimen other than nasopharyngeal swab, presence of viral mutation(s) within the areas targeted by this assay, and inadequate number of viral copies (<250 copies / mL). A negative result must be combined with clinical observations, patient history, and epidemiological information.  Fact Sheet for Patients:   04/17/20  Fact Sheet for Healthcare  Providers: BoilerBrush.com.cy  This test is not yet approved or  cleared by the https://pope.com/ FDA and has been authorized for detection and/or diagnosis of SARS-CoV-2 by FDA under an Emergency Use Authorization (EUA).  This EUA will remain in effect (meaning this test can be used) for the duration of the COVID-19 declaration under Section 564(b)(1) of the Act, 21 U.S.C. section 360bbb-3(b)(1), unless the authorization is terminated or revoked sooner.  Performed at [facility], [address] [place], [place]      Scheduled Meds: . budesonide (PULMICORT) nebulizer solution  0.5 mg Nebulization BID  . enoxaparin (LOVENOX) injection  50 mg Subcutaneous Q24H  . guaiFENesin-dextromethorphan  5 mL Oral Q6H  . ipratropium-albuterol  3 mL Nebulization Q6H  . methylPREDNISolone (SOLU-MEDROL) injection  60 mg Intravenous Q12H  . nicotine  21 mg Transdermal Daily   Continuous Infusions:  Procedures/Studies: DG Chest 2 View  Result Date: 04/15/2020 CLINICAL DATA:  Shortness of breath EXAM: CHEST - 2 VIEW COMPARISON:  04/13/2020 FINDINGS: The heart size and mediastinal contours are within normal limits.  Both lungs are clear. No pleural effusion or pneumothorax. The visualized skeletal structures are unremarkable. IMPRESSION: No acute process in the chest. Electronically Signed   By: Guadlupe Spanish M.D.   On: 04/15/2020 13:27   DG Chest Port 1 View  Result Date: 04/13/2020 CLINICAL DATA:  Cough. Additional history provided: Patient reports cough and shortness of breath since yesterday, chest heaviness. EXAM: PORTABLE CHEST 1 VIEW COMPARISON:  CT angiogram chest 02/12/2017, chest radiograph 02/12/2017 FINDINGS: Heart size within normal limits. There is no appreciable airspace consolidation. No evidence of pleural effusion or pneumothorax. No acute bony abnormality identified. IMPRESSION: No evidence of acute cardiopulmonary abnormality. Electronically Signed   By:  Jackey Loge DO   On: 04/13/2020 10:58    Catarina Hartshorn, DO  [facility]  If 7PM-7AM, please contact night-coverage [email or web address] Password [email or web address] 04/16/2020, 8:16 AM   LOS: 0 days

## 2020-04-17 DIAGNOSIS — J205 Acute bronchitis due to respiratory syncytial virus: Secondary | ICD-10-CM

## 2020-04-17 LAB — BASIC METABOLIC PANEL
Anion gap: 10 (ref 5–15)
BUN: 15 mg/dL (ref 6–20)
CO2: 22 mmol/L (ref 22–32)
Calcium: 9 mg/dL (ref 8.9–10.3)
Chloride: 103 mmol/L (ref 98–111)
Creatinine, Ser: 0.57 mg/dL (ref 0.44–1.00)
GFR calc Af Amer: >60 mL/min (ref >60)
GFR calc non Af Amer: >60 mL/min (ref >60)
Glucose, Bld: 138 mg/dL — ABNORMAL HIGH (ref 70–99)
Potassium: 4.6 mmol/L (ref 3.5–5.1)
Sodium: 135 mmol/L (ref 135–145)

## 2020-04-17 LAB — MAGNESIUM: Magnesium: 2.2 mg/dL (ref 1.7–2.4)

## 2020-04-17 LAB — GLUCOSE, CAPILLARY: Glucose-Capillary: 133 mg/dL — ABNORMAL HIGH (ref 70–99)

## 2020-04-17 MED ORDER — PREDNISONE 20 MG PO TABS: 60.0000 mg | ORAL_TABLET | Freq: Every day | ORAL | Status: DC

## 2020-04-17 MED ORDER — PREDNISONE 10 MG PO TABS: 60.0000 mg | ORAL_TABLET | Freq: Every day | ORAL | 0 refills | Status: DC

## 2020-04-17 MED ORDER — ALBUTEROL SULFATE HFA 108 (90 BASE) MCG/ACT IN AERS: 2.0000 | INHALATION_SPRAY | RESPIRATORY_TRACT | 0 refills | Status: DC | PRN

## 2020-04-17 NOTE — Discharge Summary (Signed)
Physician Discharge Summary  Patricia URIOSTEGUI XBD:[id number] DOB: 10/01/1996 DOA: 04/15/2020  PCP: Patient, No Pcp Per  Admit date: 04/15/2020 Discharge date: 04/17/2020  Admitted From: Home Disposition:  Home   Recommendations for Outpatient Follow-up:  1. Follow up with PCP in 1-2 weeks 2. Please obtain BMP/CBC in one week 3.    Discharge Condition: Stable CODE STATUS: FULL Diet recommendation:  Regular   Brief/Interim Summary: 24 year old female with a history of morbid obesity, tobacco abuse, and cannabis use presented with 3-day history of shortness of breath and coughing.  The patient presented to the emergency department on 04/13/2020 with similar symptoms.  She was sent home with prednisone, Tessalon Perles, and albuterol inhaler.  She stated that her shortness of breath and coughing persisted.  In fact, she states that she used her albuterol inhaler so much, she actually ran out.  She denied any fevers, chills, chest pain, nausea, vomiting, diarrhea, abdominal pain, dysuria, hematuria.  She continues to smoke up to 2 packs/day.  In the emergency department, the patient was afebrile hemodynamically stable.  Ambulation on room air showed desaturation down to 88%.  She continued to have shortness of breath with exertion.  As result the patient was admitted for further evaluation and treatment.  Discharge Diagnoses:  Acute respiratory failure with hypoxia -Secondary to acute bronchitis -Viral respiratory panel -Patient continues to have shortness of breath with exertion -D-dimer negative -Continue IV Solu-Medrol>>home with prednisone taper -Continue duo nebs -Added Pulmicort during hospitalization -COVID-19 negative -ambulatory pulse ox did not show any desaturation <88% on day of d/c  Acute bronchitis due to Respiratory Syncytial Virus -Viral respiratory panel--positive for RSV -Treatment plan as discussed above  Tobacco abuse -Tobacco cessation discussed  Morbid  obesity -BMI 50.85 -Lifestyle modification  Cannabis use -Cessation discussed    Discharge Instructions   Allergies as of 04/17/2020      Reactions   Amoxicillin Nausea And Vomiting, Rash      Medication List    STOP taking these medications   benzonatate 100 MG capsule Commonly known as: TESSALON     TAKE these medications   albuterol 108 (90 Base) MCG/ACT inhaler Commonly known as: VENTOLIN HFA Inhale 2 puffs into the lungs every 2 (two) hours as needed for wheezing or shortness of breath (or coughing).   predniSONE 10 MG tablet Commonly known as: DELTASONE Take 6 tablets (60 mg total) by mouth daily with breakfast. And decrease by one tablet daily Start taking on: April 18, 2020 What changed:   medication strength  how much to take  when to take this  additional instructions       Allergies  Allergen Reactions  . Amoxicillin Nausea And Vomiting and Rash    Consultations:  none   Procedures/Studies: DG Chest 2 View  Result Date: 04/15/2020 CLINICAL DATA:  Shortness of breath EXAM: CHEST - 2 VIEW COMPARISON:  04/13/2020 FINDINGS: The heart size and mediastinal contours are within normal limits. Both lungs are clear. No pleural effusion or pneumothorax. The visualized skeletal structures are unremarkable. IMPRESSION: No acute process in the chest. Electronically Signed   By: Guadlupe Spanish M.D.   On: 04/15/2020 13:27   DG Chest Port 1 View  Result Date: 04/13/2020 CLINICAL DATA:  Cough. Additional history provided: Patient reports cough and shortness of breath since yesterday, chest heaviness. EXAM: PORTABLE CHEST 1 VIEW COMPARISON:  CT angiogram chest 02/12/2017, chest radiograph 02/12/2017 FINDINGS: Heart size within normal limits. There is no appreciable airspace consolidation. No evidence  of pleural effusion or pneumothorax. No acute bony abnormality identified. IMPRESSION: No evidence of acute cardiopulmonary abnormality. Electronically Signed   By:  Jackey Loge DO   On: 04/13/2020 10:58         Discharge Exam: Vitals:   04/17/20 0433 04/17/20 0735  BP: 120/66   Pulse: 78   Resp: 16   Temp: 98 F (36.7 C)   SpO2: 95% 90%   Vitals:   04/16/20 2029 04/16/20 2104 04/17/20 0433 04/17/20 0735  BP:  (!) 138/91 120/66   Pulse: 89 87 78   Resp: 18 16 16    Temp:  97.9 F (36.6 C) 98 F (36.7 C)   TempSrc:  Oral Oral   SpO2: 95% 93% 95% 90%  Weight:      Height:        General: Pt is alert, awake, not in acute distress Cardiovascular: RRR, S1/S2 +, no rubs, no gallops Respiratory: bibasilar rales. No wheeze Abdominal: Soft, NT, ND, bowel sounds + Extremities: no edema, no cyanosis   The results of significant diagnostics from this hospitalization (including imaging, microbiology, ancillary and laboratory) are listed below for reference.    Significant Diagnostic Studies: DG Chest 2 View  Result Date: 04/15/2020 CLINICAL DATA:  Shortness of breath EXAM: CHEST - 2 VIEW COMPARISON:  04/13/2020 FINDINGS: The heart size and mediastinal contours are within normal limits. Both lungs are clear. No pleural effusion or pneumothorax. The visualized skeletal structures are unremarkable. IMPRESSION: No acute process in the chest. Electronically Signed   By: 04/15/2020 M.D.   On: 04/15/2020 13:27   DG Chest Port 1 View  Result Date: 04/13/2020 CLINICAL DATA:  Cough. Additional history provided: Patient reports cough and shortness of breath since yesterday, chest heaviness. EXAM: PORTABLE CHEST 1 VIEW COMPARISON:  CT angiogram chest 02/12/2017, chest radiograph 02/12/2017 FINDINGS: Heart size within normal limits. There is no appreciable airspace consolidation. No evidence of pleural effusion or pneumothorax. No acute bony abnormality identified. IMPRESSION: No evidence of acute cardiopulmonary abnormality. Electronically Signed   By: 02/14/2017 DO   On: 04/13/2020 10:58     Microbiology: Recent Results (from the past 240  hour(s))  SARS Coronavirus 2 by RT PCR (hospital order, performed in [facility] hospital lab) Nasopharyngeal Nasopharyngeal Swab     Status: None   Collection Time: 04/15/20  2:42 PM   Specimen: Nasopharyngeal Swab  Result Value Ref Range Status   SARS Coronavirus 2 NEGATIVE NEGATIVE Final    Comment: (NOTE) SARS-CoV-2 target nucleic acids are NOT DETECTED.  The SARS-CoV-2 RNA is generally detectable in upper and lower respiratory specimens during the acute phase of infection. The lowest concentration of SARS-CoV-2 viral copies this assay can detect is 250 copies / mL. A negative result does not preclude SARS-CoV-2 infection and should not be used as the sole basis for treatment or other patient management decisions.  A negative result may occur with improper specimen collection / handling, submission of specimen other than nasopharyngeal swab, presence of viral mutation(s) within the areas targeted by this assay, and inadequate number of viral copies (<250 copies / mL). A negative result must be combined with clinical observations, patient history, and epidemiological information.  Fact Sheet for Patients:   04/17/20  Fact Sheet for Healthcare Providers: BoilerBrush.com.cy  This test is not yet approved or  cleared by the https://pope.com/ FDA and has been authorized for detection and/or diagnosis of SARS-CoV-2 by FDA under an Emergency Use Authorization (EUA).  This  EUA will remain in effect (meaning this test can be used) for the duration of the COVID-19 declaration under Section 564(b)(1) of the Act, 21 U.S.C. section 360bbb-3(b)(1), unless the authorization is terminated or revoked sooner.  Performed at [facility], [address] [place], [place] [address]   Respiratory Panel by PCR     Status: Abnormal   Collection Time: 04/16/20 11:10 AM   Specimen: Nasopharyngeal Swab; Respiratory  Result Value Ref Range Status    Adenovirus NOT DETECTED NOT DETECTED Final   Coronavirus 229E NOT DETECTED NOT DETECTED Final    Comment: (NOTE) The Coronavirus on the Respiratory Panel, DOES NOT test for the novel  Coronavirus (2019 nCoV)    Coronavirus HKU1 NOT DETECTED NOT DETECTED Final   Coronavirus NL63 NOT DETECTED NOT DETECTED Final   Coronavirus OC43 NOT DETECTED NOT DETECTED Final   Metapneumovirus NOT DETECTED NOT DETECTED Final   Rhinovirus / Enterovirus NOT DETECTED NOT DETECTED Final   Influenza A NOT DETECTED NOT DETECTED Final   Influenza B NOT DETECTED NOT DETECTED Final   Parainfluenza Virus 1 NOT DETECTED NOT DETECTED Final   Parainfluenza Virus 2 NOT DETECTED NOT DETECTED Final   Parainfluenza Virus 3 NOT DETECTED NOT DETECTED Final   Parainfluenza Virus 4 NOT DETECTED NOT DETECTED Final   Respiratory Syncytial Virus DETECTED (A) NOT DETECTED Final   Bordetella pertussis NOT DETECTED NOT DETECTED Final   Chlamydophila pneumoniae NOT DETECTED NOT DETECTED Final   Mycoplasma pneumoniae NOT DETECTED NOT DETECTED Final    Comment: Performed at [facility], [address] [place], [place] [address]     Labs: Basic Metabolic Panel: Recent Labs  Lab 04/15/20 1600 04/17/20 0512  NA 138 135  K 3.6 4.6  CL 105 103  CO2 21* 22  GLUCOSE 121* 138*  BUN 6 15  CREATININE 0.65 0.57  CALCIUM 8.5* 9.0  MG  --  2.2   Liver Function Tests: No results for input(s): AST, ALT, ALKPHOS, BILITOT, PROT, ALBUMIN in the last 168 hours. No results for input(s): LIPASE, AMYLASE in the last 168 hours. No results for input(s): AMMONIA in the last 168 hours. CBC: Recent Labs  Lab 04/15/20 1600  WBC 10.7*  NEUTROABS 8.9*  HGB 13.4  HCT 41.8  MCV 88.9  PLT 373   Cardiac Enzymes: No results for input(s): CKTOTAL, CKMB, CKMBINDEX, TROPONINI in the last 168 hours. BNP: Invalid input(s): POCBNP CBG: Recent Labs  Lab 04/16/20 0727 04/17/20 0557  GLUCAP 96 133*    Time coordinating discharge:   36 minutes  Signed:  Catarina Hartshorn, DO [facility] Pager: [phone] 04/17/2020, 8:17 AM

## 2020-11-06 ENCOUNTER — Encounter (HOSPITAL_COMMUNITY): Payer: Self-pay | Admitting: Emergency Medicine

## 2020-11-06 ENCOUNTER — Emergency Department (HOSPITAL_COMMUNITY)
Admission: EM | Admit: 2020-11-06 | Discharge: 2020-11-06 | Disposition: A | Discharge status: Home / Self Care | Payer: HRSA Program | Attending: Emergency Medicine | Admitting: Emergency Medicine

## 2020-11-06 ENCOUNTER — Other Ambulatory Visit: Payer: Self-pay

## 2020-11-06 DIAGNOSIS — R519 Headache, unspecified: Secondary | ICD-10-CM | POA: Diagnosis present

## 2020-11-06 DIAGNOSIS — U071 COVID-19: Secondary | ICD-10-CM | POA: Diagnosis not present

## 2020-11-06 DIAGNOSIS — F17210 Nicotine dependence, cigarettes, uncomplicated: Secondary | ICD-10-CM | POA: Insufficient documentation

## 2020-11-06 DIAGNOSIS — B349 Viral infection, unspecified: Secondary | ICD-10-CM

## 2020-11-06 LAB — GROUP A STREP BY PCR: Group A Strep by PCR: NOT DETECTED

## 2020-11-06 NOTE — Discharge Instructions (Signed)
Your strep test was negative.  You need to isolate at home until you get your test results for the COVID 19 virus.  If you have a positive test you will need to isolate for 5 days from the onset of your symptoms.  You may leave the house with a facemask in place for up to 10 days and after that you are fine to return to normal activity.  You should also be fever free with improving symptoms.  You may take Tylenol or Motrin for your discomfort.

## 2020-11-06 NOTE — ED Triage Notes (Signed)
Pt c/o of sore throat, fatigue, and generalized pain since 11/01/20.  Pt has not been tested for covid.

## 2020-11-06 NOTE — ED Provider Notes (Signed)
[facility] EMERGENCY DEPARTMENT Provider Note   CSN: [id number] Arrival date & time: 11/06/20  1404     History Chief Complaint  Patient presents with  . Sore Throat    Patricia Stokes is a 25 y.o. female who presents emergency department chief complaint of sore throat, fatigue, headaches.  She is unvaccinated against coronavirus.  She denies cough, nausea, vomiting, diarrhea, fevers.  She is able to swallow liquids. HPI     History reviewed. No pertinent past medical history.  Patient Active Problem List   Diagnosis Date Noted  . Acute bronchitis due to respiratory syncytial virus (RSV) 04/17/2020  . Acute hypoxemic respiratory failure (HCC)   . Morbid obesity with BMI of 45.0-49.9, adult (HCC) 04/15/2020  . Bronchitis 02/12/2017    Past Surgical History:  Procedure Laterality Date  . TONSILLECTOMY       OB History   No obstetric history on file.     History reviewed. No pertinent family history.  Social History   Tobacco Use  . Smoking status: Current Every Day Smoker    Packs/day: 1.50    Types: Cigarettes  . Smokeless tobacco: Never Used  Vaping Use  . Vaping Use: Never used  Substance Use Topics  . Alcohol use: No  . Drug use: No    Home Medications Prior to Admission medications   Medication Sig Start Date End Date Taking? Authorizing Provider  albuterol (VENTOLIN HFA) 108 (90 Base) MCG/ACT inhaler Inhale 2 puffs into the lungs every 2 (two) hours as needed for wheezing or shortness of breath (or coughing). 04/17/20   Catarina Hartshorn, MD  predniSONE (DELTASONE) 10 MG tablet Take 6 tablets (60 mg total) by mouth daily with breakfast. And decrease by one tablet daily 04/18/20   Tat, Onalee Hua, MD  diphenhydrAMINE (BENADRYL) 25 MG tablet Take at bedtime prn itching. 01/01/15 01/22/15  Janne Napoleon, NP  loratadine (CLARITIN) 10 MG tablet Take 1 tablet (10 mg total) by mouth daily. 01/01/15 01/22/15  Janne Napoleon, NP    Allergies    Amoxicillin  Review of Systems    Review of Systems  Constitutional: Positive for chills and fatigue. Negative for fever.  HENT: Positive for sore throat.   Respiratory: Negative for cough and shortness of breath.   Gastrointestinal: Negative for diarrhea, nausea and vomiting.  Neurological: Positive for headaches.    Physical Exam Updated Vital Signs BP (!) 121/58 (BP Location: Right Arm)   Pulse 67   Temp 98.1 F (36.7 C) (Oral)   Resp 20   Ht 5' (1.524 m)   Wt 122.5 kg   LMP 10/23/2020 (Exact Date)   SpO2 96%   BMI 52.73 kg/m   Physical Exam Vitals and nursing note reviewed.  Constitutional:      General: She is not in acute distress.    Appearance: She is well-developed and well-nourished. She is not diaphoretic.  HENT:     Head: Normocephalic and atraumatic.     Right Ear: Tympanic membrane normal.     Mouth/Throat:     Mouth: Mucous membranes are moist. No oral lesions.     Pharynx: Uvula midline. No pharyngeal swelling, oropharyngeal exudate, posterior oropharyngeal erythema or uvula swelling.     Tonsils: No tonsillar exudate or tonsillar abscesses.  Eyes:     General: No scleral icterus.    Conjunctiva/sclera: Conjunctivae normal.     Pupils: Pupils are equal, round, and reactive to light.  Cardiovascular:     Rate and  Rhythm: Normal rate and regular rhythm.     Heart sounds: Normal heart sounds. No murmur heard. No friction rub. No gallop.   Pulmonary:     Effort: Pulmonary effort is normal. No respiratory distress.     Breath sounds: Normal breath sounds.  Abdominal:     General: Bowel sounds are normal. There is no distension.     Palpations: Abdomen is soft. There is no mass.     Tenderness: There is no abdominal tenderness. There is no guarding.  Musculoskeletal:     Cervical back: Normal range of motion and neck supple.  Lymphadenopathy:     Cervical: No cervical adenopathy.  Skin:    General: Skin is warm and dry.  Neurological:     Mental Status: She is alert and oriented to  person, place, and time.  Psychiatric:        Behavior: Behavior normal.     ED Results / Procedures / Treatments   Labs (all labs ordered are listed, but only abnormal results are displayed) Labs Reviewed  GROUP A STREP BY PCR  SARS CORONAVIRUS 2 (TAT 6-24 HRS)    EKG None  Radiology No results found.  Procedures Procedures (including critical care time)  Medications Ordered in ED Medications - No data to display  ED Course  I have reviewed the triage vital signs and the nursing notes.  Pertinent labs & imaging results that were available during my care of the patient were reviewed by me and considered in my medical decision making (see chart for details).    MDM Rules/Calculators/A&P                          Pt afebrile without tonsillar exudate, negative strep. Presents with mild cervical lymphadenopathy, & dysphagia; diagnosis of viral pharyngitis. No abx indicated. DC w symptomatic tx for pain  Pt does not appear dehydrated, but did discuss importance of water rehydration. Presentation non concerning for PTA or infxn spread to soft tissue. No trismus or uvula deviation. Specific return precautions discussed. Pt able to drink water in ED without difficulty with intact air way. Recommended PCP follow up. ISOLATION PRECAUTIONS GIVEN.  Patricia Stokes was evaluated in Emergency Department on 11/06/2020 for the symptoms described in the history of present illness. She was evaluated in the context of the global COVID-19 pandemic, which necessitated consideration that the patient might be at risk for infection with the SARS-CoV-2 virus that causes COVID-19. Institutional protocols and algorithms that pertain to the evaluation of patients at risk for COVID-19 are in a state of rapid change based on information released by regulatory bodies including the CDC and federal and state organizations. These policies and algorithms were followed during the patient's care in the ED.  Final  Clinical Impression(s) / ED Diagnoses Final diagnoses:  None    Rx / DC Orders ED Discharge Orders    None       Arthor Captain, PA-C 11/06/20 1718    Pollyann Savoy, MD 11/06/20 [phone]

## 2020-11-07 LAB — SARS CORONAVIRUS 2 (TAT 6-24 HRS): SARS Coronavirus 2: POSITIVE — AB

## 2021-01-11 IMAGING — DX DG CHEST 1V PORT
1 series · 1 of 1 positions shown · non-contrast
Comparison: CT angiogram chest 02/12/2017, chest radiograph
02/12/2017

CLINICAL DATA: Cough. Additional history provided: Patient reports
cough and shortness of breath since yesterday, chest heaviness.

EXAM:
PORTABLE CHEST 1 VIEW

[chest ap]
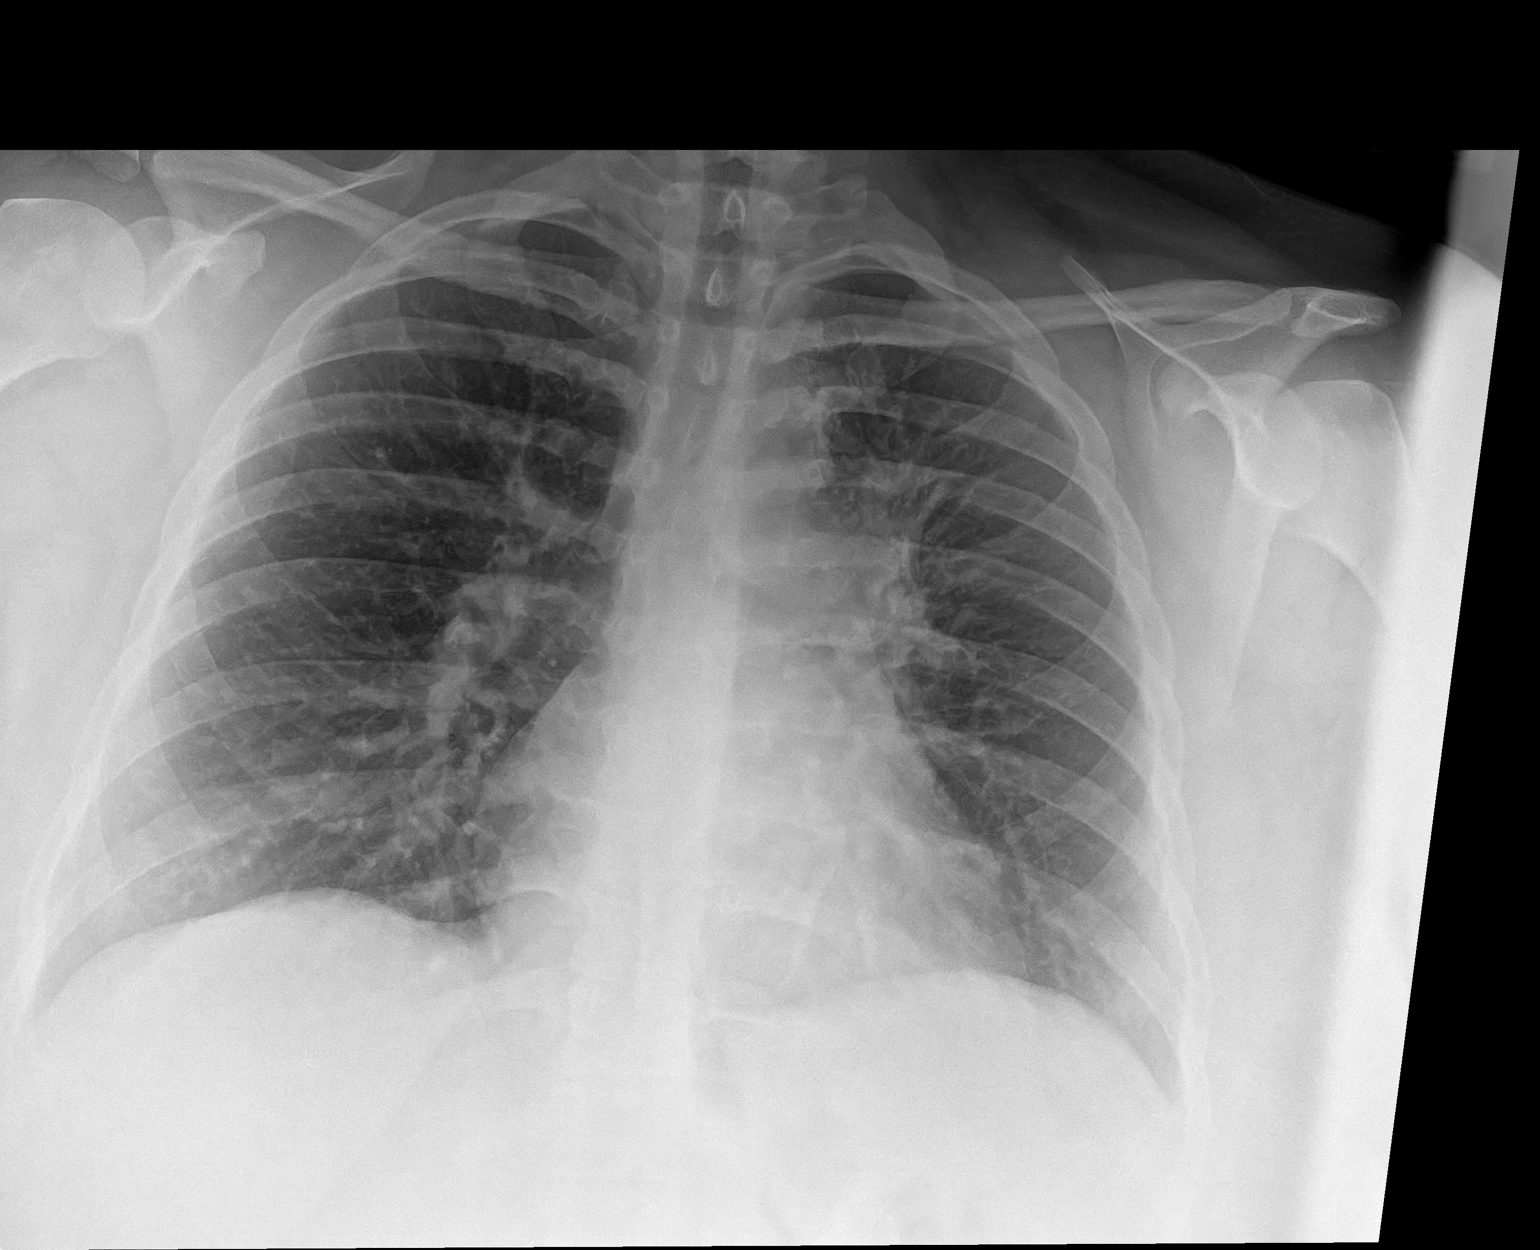

[1 of 1 positions shown; findings below may reference images not displayed]

FINDINGS: Heart size within normal limits.

There is no appreciable airspace consolidation.

No evidence of pleural effusion or pneumothorax.

No acute bony abnormality identified.
IMPRESSION: No evidence of acute cardiopulmonary abnormality.

## 2021-01-13 IMAGING — DX DG CHEST 2V
2 series · 2 of 2 positions shown · non-contrast
Comparison: 04/13/2020

CLINICAL DATA: Shortness of breath

EXAM:
CHEST - 2 VIEW

[chest pa]
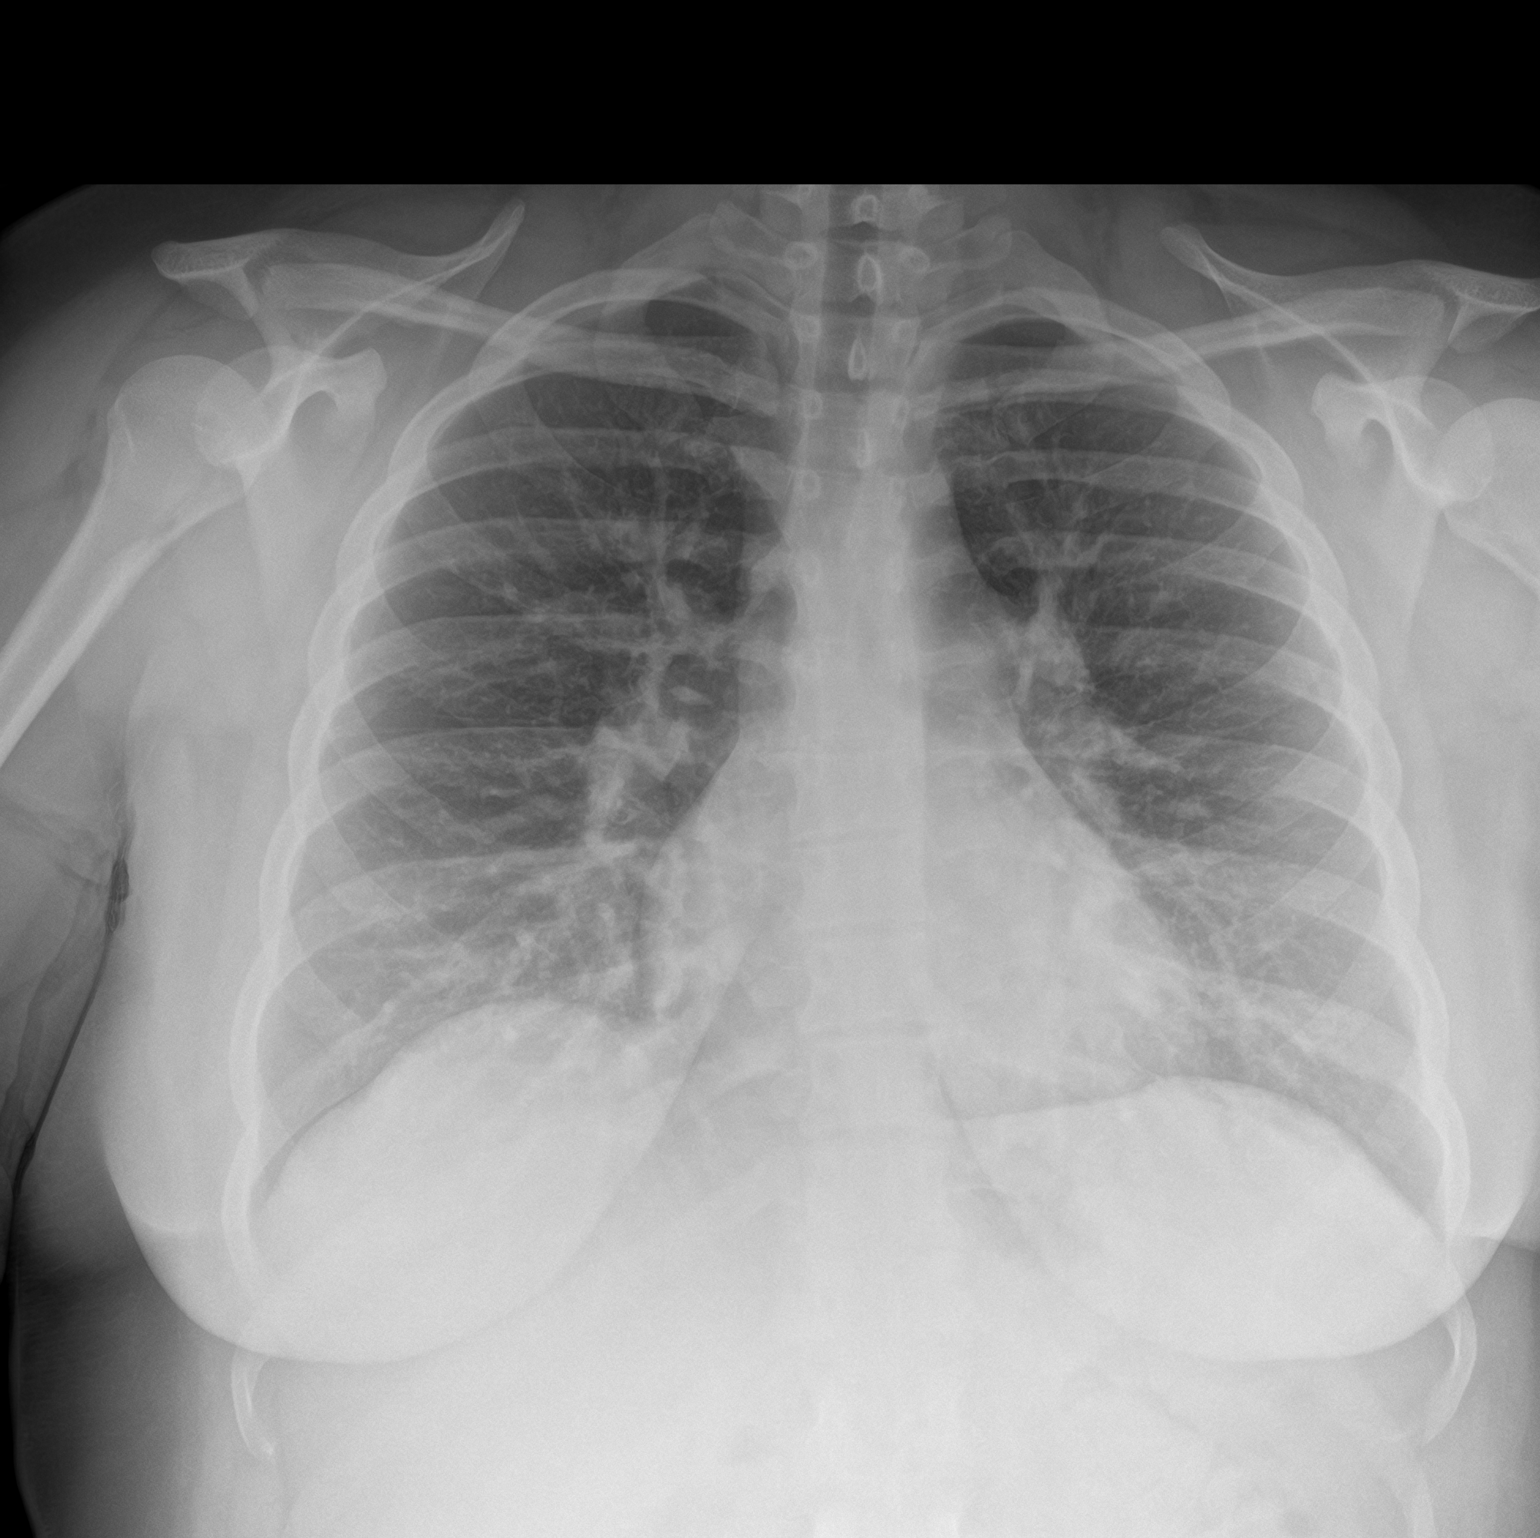

[chest lat]
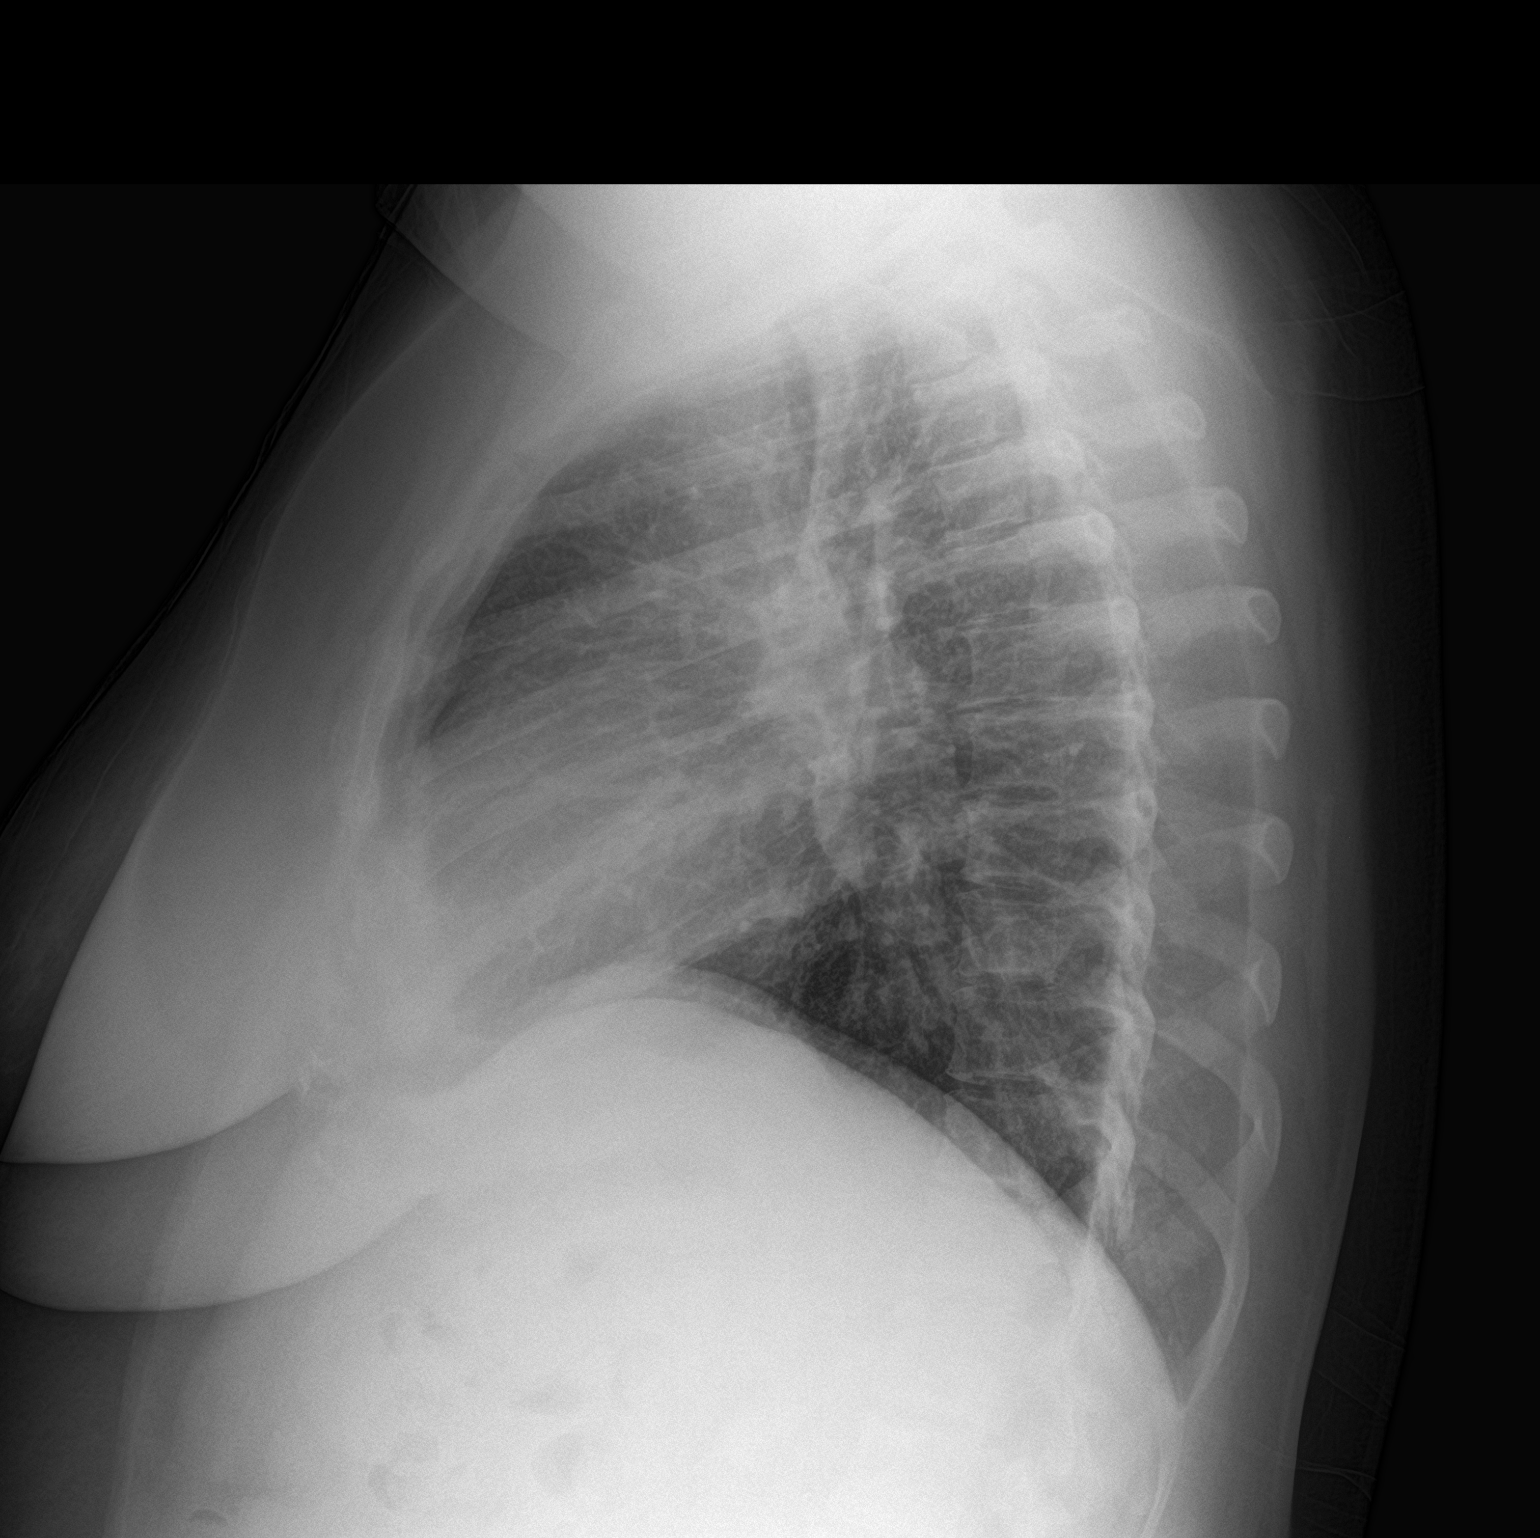

[2 of 2 positions shown; findings below may reference images not displayed]

FINDINGS: The heart size and mediastinal contours are within normal limits.
Both lungs are clear. No pleural effusion or pneumothorax. The
visualized skeletal structures are unremarkable.
IMPRESSION: No acute process in the chest.

## 2022-04-11 DIAGNOSIS — Z3689 Encounter for other specified antenatal screening: Secondary | ICD-10-CM | POA: Diagnosis not present

## 2022-04-11 DIAGNOSIS — N912 Amenorrhea, unspecified: Secondary | ICD-10-CM | POA: Diagnosis not present

## 2022-04-11 DIAGNOSIS — Z3201 Encounter for pregnancy test, result positive: Secondary | ICD-10-CM | POA: Diagnosis not present

## 2022-04-27 ENCOUNTER — Encounter (HOSPITAL_COMMUNITY): Payer: Self-pay | Admitting: *Deleted

## 2022-04-27 ENCOUNTER — Other Ambulatory Visit: Payer: Self-pay

## 2022-04-27 ENCOUNTER — Emergency Department (HOSPITAL_COMMUNITY)
Admission: EM | Admit: 2022-04-27 | Discharge: 2022-04-27 | Disposition: A | Discharge status: Home / Self Care | Payer: Medicaid Other | Attending: Emergency Medicine | Admitting: Emergency Medicine

## 2022-04-27 DIAGNOSIS — O99511 Diseases of the respiratory system complicating pregnancy, first trimester: Secondary | ICD-10-CM | POA: Diagnosis not present

## 2022-04-27 DIAGNOSIS — J029 Acute pharyngitis, unspecified: Secondary | ICD-10-CM | POA: Insufficient documentation

## 2022-04-27 DIAGNOSIS — Z20822 Contact with and (suspected) exposure to covid-19: Secondary | ICD-10-CM | POA: Insufficient documentation

## 2022-04-27 LAB — GROUP A STREP BY PCR: Group A Strep by PCR: NOT DETECTED

## 2022-04-27 LAB — RESP PANEL BY RT-PCR (FLU A&B, COVID) ARPGX2
Influenza A by PCR: NEGATIVE
Influenza B by PCR: NEGATIVE
SARS Coronavirus 2 by RT PCR: NEGATIVE

## 2022-04-27 MED ORDER — LIDOCAINE VISCOUS HCL 2 % MT SOLN: 5.0000 mL | Freq: Three times a day (TID) | OROMUCOSAL | 0 refills | Status: DC | PRN

## 2022-04-27 NOTE — ED Notes (Signed)
Pt is [redacted] wks pregnant

## 2022-04-27 NOTE — ED Triage Notes (Signed)
Pt c/o sore throat onset this am, denies cough our ear pain, denies fever & chills

## 2022-04-27 NOTE — ED Provider Notes (Signed)
[facility] EMERGENCY DEPARTMENT Provider Note   CSN: [id number] Arrival date & time: 04/27/22  1750     History  Chief Complaint  Patient presents with   Sore Throat    Patricia Stokes is a 26 y.o. female.   Sore Throat Pertinent negatives include no chest pain, no abdominal pain, no headaches and no shortness of breath.        Patricia Stokes is a 26 y.o. female G1, P0 at 10 weeks by LMP who presents to the Emergency Department complaining of sore throat.  Symptoms began yesterday.  Initially states her throat felt "scratchy" today, woke with worsening pain over the throat associated with swallowing.  States she is continue to drink fluids but has not been able to tolerate solid foods due to throat pain.  She denies any fever, chills, ear pain, nasal congestion or cough.  She has had her initial prenatal visit and is scheduled for her second visit later this month.  She denies any abdominal pain vaginal bleeding or spotting.  No pregnancy related complications thus far.   Home Medications Prior to Admission medications   Medication Sig Start Date End Date Taking? Authorizing Provider  albuterol (VENTOLIN HFA) 108 (90 Base) MCG/ACT inhaler Inhale 2 puffs into the lungs every 2 (two) hours as needed for wheezing or shortness of breath (or coughing). 04/17/20   Catarina Hartshorn, MD  predniSONE (DELTASONE) 10 MG tablet Take 6 tablets (60 mg total) by mouth daily with breakfast. And decrease by one tablet daily 04/18/20   Tat, Onalee Hua, MD  diphenhydrAMINE (BENADRYL) 25 MG tablet Take at bedtime prn itching. 01/01/15 01/22/15  Janne Napoleon, NP  loratadine (CLARITIN) 10 MG tablet Take 1 tablet (10 mg total) by mouth daily. 01/01/15 01/22/15  Janne Napoleon, NP      Allergies    Amoxicillin    Review of Systems   Review of Systems  Constitutional:  Negative for appetite change, chills and fever.  HENT:  Positive for sore throat. Negative for congestion, ear pain, sinus pressure, sinus pain, trouble  swallowing and voice change.   Respiratory:  Negative for cough, chest tightness and shortness of breath.   Cardiovascular:  Negative for chest pain.  Gastrointestinal:  Negative for abdominal pain, nausea and vomiting.  Genitourinary:  Negative for dysuria.  Musculoskeletal:  Negative for neck pain.  Skin:  Negative for color change.  Neurological:  Negative for dizziness, syncope, weakness, numbness and headaches.    Physical Exam Updated Vital Signs BP 98/81 (BP Location: Right Arm)   Pulse 89   Temp 98.1 F (36.7 C) (Oral)   Resp 18   Ht 5' (1.524 m)   LMP 02/12/2022 (Exact Date)   SpO2 98%   BMI 52.73 kg/m  Physical Exam Vitals and nursing note reviewed.  Constitutional:      Appearance: Normal appearance. She is well-developed. She is not ill-appearing or toxic-appearing.  HENT:     Mouth/Throat:     Mouth: Mucous membranes are moist.     Pharynx: No oropharyngeal exudate.     Comments: Mild erythema of the oropharynx without edema.  No bulging of the soft palate.  No vesicles or exudates noted.  Uvula midline nonedematous.  Tonsils are absent. Cardiovascular:     Rate and Rhythm: Normal rate and regular rhythm.     Pulses: Normal pulses.  Pulmonary:     Effort: Pulmonary effort is normal.     Breath sounds: Normal breath sounds.  Abdominal:     Palpations: Abdomen is soft.     Tenderness: There is no abdominal tenderness.  Musculoskeletal:        General: Normal range of motion.     Cervical back: Normal range of motion.  Lymphadenopathy:     Cervical: No cervical adenopathy.  Skin:    General: Skin is warm.     Capillary Refill: Capillary refill takes less than 2 seconds.     Findings: No rash.  Neurological:     General: No focal deficit present.     Mental Status: She is alert.     Sensory: No sensory deficit.     Motor: No weakness.     ED Results / Procedures / Treatments   Labs (all labs ordered are listed, but only abnormal results are  displayed) Labs Reviewed  RESP PANEL BY RT-PCR (FLU A&B, COVID) [id number]  GROUP A STREP BY PCR    EKG None  Radiology No results found.  Procedures Procedures    Medications Ordered in ED Medications - No data to display  ED Course/ Medical Decision Making/ A&P                           Medical Decision Making Patient with sore throat, [redacted] weeks pregnant.  No vaginal or pelvic complaints.  Sore throat symptoms began yesterday no other associated URI symptoms  On exam, patient has mildly erythematous oropharynx without edema or erythema.  No concerning symptoms for peritonsillar or retropharyngeal abscess.  She is handling her secretions well.  Uvula is midline.  Mucous membranes are moist.  Tonsils are absent  I suspect this is related to viral process.  Strep test negative.  COVID and flu test are pending.  Felt to be less likely.  Patient agreeable to symptomatic treatment, Tylenol if needed for pain.  Soft foods and liquids as tolerated.  She will follow-up with her PCP if needed.  Appears appropriate for discharge home.  Agreeable to review COVID flu testing on MyChart             Final Clinical Impression(s) / ED Diagnoses Final diagnoses:  None    Rx / DC Orders ED Discharge Orders     None         Pauline Aus, PA-C 04/27/22 1926    Franne Forts, DO 04/28/22 1511

## 2022-04-27 NOTE — Discharge Instructions (Signed)
Your strep test today was negative.  Your COVID and flu test are pending.  You may review the results on MyChart later this evening.  You may take Tylenol if needed for discomfort.  Use the Magic mouthwash as directed do not swallow.  Follow-up with your primary care provider for recheck if needed.

## 2022-04-29 ENCOUNTER — Other Ambulatory Visit: Payer: Self-pay

## 2022-04-29 ENCOUNTER — Emergency Department (HOSPITAL_COMMUNITY)
Admission: EM | Admit: 2022-04-29 | Discharge: 2022-04-29 | Disposition: A | Discharge status: Home / Self Care | Payer: Medicaid Other | Attending: Student | Admitting: Student

## 2022-04-29 ENCOUNTER — Encounter (HOSPITAL_COMMUNITY): Payer: Self-pay

## 2022-04-29 DIAGNOSIS — Z3A10 10 weeks gestation of pregnancy: Secondary | ICD-10-CM | POA: Diagnosis not present

## 2022-04-29 DIAGNOSIS — J36 Peritonsillar abscess: Secondary | ICD-10-CM | POA: Diagnosis not present

## 2022-04-29 DIAGNOSIS — O99331 Smoking (tobacco) complicating pregnancy, first trimester: Secondary | ICD-10-CM | POA: Diagnosis not present

## 2022-04-29 DIAGNOSIS — O99511 Diseases of the respiratory system complicating pregnancy, first trimester: Secondary | ICD-10-CM | POA: Diagnosis not present

## 2022-04-29 DIAGNOSIS — F17200 Nicotine dependence, unspecified, uncomplicated: Secondary | ICD-10-CM | POA: Diagnosis not present

## 2022-04-29 DIAGNOSIS — O26891 Other specified pregnancy related conditions, first trimester: Secondary | ICD-10-CM | POA: Diagnosis present

## 2022-04-29 LAB — GROUP A STREP BY PCR: Group A Strep by PCR: NOT DETECTED

## 2022-04-29 MED ORDER — ACETAMINOPHEN 160 MG/5ML PO SOLN
650.0000 mg | Freq: Once | ORAL | Status: AC
  Administered 2022-04-29: 650 mg via ORAL
  Filled 2022-04-29: qty 20.3

## 2022-04-29 MED ORDER — CEFPODOXIME PROXETIL 100 MG PO TABS: 200.0000 mg | ORAL_TABLET | Freq: Two times a day (BID) | ORAL | 0 refills | Status: AC

## 2022-04-29 MED ORDER — SODIUM CHLORIDE 0.9 % IV SOLN
1.0000 g | Freq: Once | INTRAVENOUS | Status: AC
  Administered 2022-04-29: 1 g via INTRAVENOUS
  Filled 2022-04-29: qty 10

## 2022-04-29 MED ORDER — ACETAMINOPHEN 160 MG/5ML PO ELIX: 640.0000 mg | ORAL_SOLUTION | Freq: Four times a day (QID) | ORAL | 0 refills | Status: AC | PRN

## 2022-04-29 MED ORDER — METHYLPREDNISOLONE 4 MG PO TBPK: ORAL_TABLET | ORAL | 0 refills | Status: AC

## 2022-04-29 MED ORDER — LIDOCAINE VISCOUS HCL 2 % MT SOLN
15.0000 mL | Freq: Once | OROMUCOSAL | Status: AC
  Administered 2022-04-29: 15 mL via OROMUCOSAL
  Filled 2022-04-29: qty 15

## 2022-04-29 MED ORDER — DEXAMETHASONE SODIUM PHOSPHATE 10 MG/ML IJ SOLN
10.0000 mg | Freq: Once | INTRAMUSCULAR | Status: AC
  Administered 2022-04-29: 10 mg via INTRAVENOUS
  Filled 2022-04-29: qty 1

## 2022-04-29 NOTE — ED Provider Notes (Signed)
[facility] EMERGENCY DEPARTMENT Provider Note   CSN: [id number] Arrival date & time: 04/29/22  1619     History  Chief Complaint  Patient presents with   Sore Throat    Patricia Stokes is a 26 y.o. female  G1, P0 at 10 weeks by LMP returning to the ED due to continued sore throat.  Was seen 2 days ago, tested negative for COVID and strep.  Recommended symptomatic treatment and follow-up with PCP.  Today, increased difficulty swallowing and feels a "lump "in her throat.  Denies fevers, shortness of breath, chest pain, headache, dizziness, ear pain, neck pain or stiffness, or inability to swallow.  Without cough or chest tightness.  No recent antibiotics.  The history is provided by the patient and medical records.  Sore Throat       Home Medications Prior to Admission medications   Medication Sig Start Date End Date Taking? Authorizing Provider  acetaminophen (TYLENOL) 500 MG tablet Take 500 mg by mouth every 6 (six) hours as needed for moderate pain.   Yes [provider]  diphenhydrAMINE (BENADRYL) 25 MG tablet Take at bedtime prn itching. 01/01/15 01/22/15  Janne Napoleon, NP  loratadine (CLARITIN) 10 MG tablet Take 1 tablet (10 mg total) by mouth daily. 01/01/15 01/22/15  Janne Napoleon, NP      Allergies    Amoxicillin    Review of Systems   Review of Systems  HENT:  Positive for sore throat and trouble swallowing.     Physical Exam Updated Vital Signs BP 122/73 (BP Location: Right Arm)   Pulse 97   Temp 98.3 F (36.8 C) (Oral)   Resp 20   Ht 5' (1.524 m)   Wt 105.2 kg   LMP 02/12/2022 (Exact Date)   SpO2 93%   BMI 45.31 kg/m  Physical Exam Vitals and nursing note reviewed.  Constitutional:      General: She is not in acute distress.    Appearance: She is well-developed. She is ill-appearing. She is not toxic-appearing or diaphoretic.  HENT:     Head: Normocephalic and atraumatic.     Right Ear: Tympanic membrane and ear canal normal.     Left Ear:  Tympanic membrane and ear canal normal.     Nose: No congestion or rhinorrhea.     Mouth/Throat:     Mouth: Mucous membranes are moist.     Pharynx: Oropharynx is clear. Posterior oropharyngeal erythema present. No pharyngeal swelling or oropharyngeal exudate.     Tonsils: No tonsillar exudate.     Comments: Deviated uvula.  Mild excessive saliva and erythema in oropharynx.  Still able to swallow, but with observed elicited tenderness. Eyes:     Conjunctiva/sclera: Conjunctivae normal.  Cardiovascular:     Rate and Rhythm: Normal rate and regular rhythm.     Heart sounds: No murmur heard. Pulmonary:     Effort: Pulmonary effort is normal. No respiratory distress.     Breath sounds: Normal breath sounds. No wheezing.  Chest:     Chest wall: No tenderness.  Abdominal:     Palpations: Abdomen is soft.     Tenderness: There is no abdominal tenderness.  Musculoskeletal:        General: No swelling.     Cervical back: Neck supple.  Skin:    General: Skin is warm and dry.     Capillary Refill: Capillary refill takes less than 2 seconds.  Neurological:     Mental Status: She is  alert and oriented to person, place, and time.  Psychiatric:        Mood and Affect: Mood normal.     ED Results / Procedures / Treatments   Labs (all labs ordered are listed, but only abnormal results are displayed) Labs Reviewed  GROUP A STREP BY PCR    EKG None  Radiology No results found.  Procedures Procedures    Medications Ordered in ED Medications - No data to display  ED Course/ Medical Decision Making/ A&P                           Medical Decision Making Risk OTC drugs. Prescription drug management.   26 y.o. female presents to the ED for concern of Sore Throat   This involves an extensive number of treatment options, and is a complaint that carries with it a high risk of complications and morbidity.    Past Medical History / Co-morbidities / Social History: Hx of prior  tonsillectomy, daily tobacco use Social Determinants of Health include chronic tobacco use, for cessation counseling was provided  Additional History:  Internal and external records from outside source obtained and reviewed including ED visit for recent sore throat evaluation and treatment on 04/27/2022  Lab Tests: I ordered, and personally interpreted labs.  The pertinent results include:   Strep: Pending  Imaging Studies: None  ED Course: Pt mildly anxious-appearing on exam.  Seen at a few days ago for sore throat.  Currently at [redacted] weeks gestation.  Tested negative for COVID and strep, was encouraged for symptomatic management follow-up with PCP.  Group A strep pending.  Now with increased difficulty swallowing and sensation of a "lump" on 1 side of her throat.  Uvula appears deviated.  Throat significantly tender and possible mass palpated on exam.  ABCs intact, still able to swallow liquids at this time.  Does not appear to be in respiratory distress.  Presentation consistent with developing peritonsillar abscess.  Due to pregnancy status, would like to avoid CT imaging.  Plan to proceed with symptom management and reassess.  Disposition: 1934  care of ELLIANA SANDIEGO transferred to PA Jodi Geralds at the end of my shift.  Patient case discussed in depth.  Please see his/her note for further details.  Plan at time of handoff is to provide symptomatic management for likely peritonsillar abscess.  If patient improves, may likely continue outpatient treatment.  Status remains the same, or worsens, likely admission for further treatment.  This may be altered or completely changed at the discretion of the oncoming team pending results of further workup.  I discussed this case with my attending physician Dr. Posey Rea, who agreed with the proposed treatment course and cosigned this note including patient's presenting symptoms, physical exam, and planned diagnostics and interventions.  Attending physician  stated agreement with plan or made changes to plan which were implemented.     This chart was dictated using voice recognition software.  Despite best efforts to proofread, errors can occur which can change the documentation meaning.         Final Clinical Impression(s) / ED Diagnoses Final diagnoses:  Peritonsillar abscess    Rx / DC Orders ED Discharge Orders     None         Sandrea Hammond 05/03/22 1221    Kommor, Wyn Forster, MD 05/03/22 [phone]

## 2022-04-29 NOTE — Discharge Instructions (Addendum)
Please call your ENT doctor first thing Monday morning to schedule close follow-up.  Take Tylenol 20 mL every 6 hours to help with pain, take steroid Dosepak starting tomorrow morning as directed to help continue to improve swelling and discomfort.  Take antibiotics twice daily with food.  You can also use over-the-counter Cepacol throat lozenges to help with throat pain.  If you develop increased difficulty swallowing, are unable to swallow your saliva, have any difficulty breathing return to the emergency department.

## 2022-04-29 NOTE — ED Provider Notes (Signed)
Care assumed from [facility], please see her note for full details, but in brief Patricia Stokes is a 26 y.o. female who is [redacted] weeks pregnant, presents with sore throat and difficulty swallowing.  Seen yesterday with negative strep but increased pain trouble swallowing so returned.  Presentation concerning for peritonsillar abscess.  Plan: Patient currently pregnant and would like to avoid CT.  We will attempt medical management and see if patient clinically improves.    BP 122/73 (BP Location: Right Arm)   Pulse 97   Temp 98.3 F (36.8 C) (Oral)   Resp 20   Ht 5' (1.524 m)   Wt 105.2 kg   LMP 02/12/2022 (Exact Date)   SpO2 93%   BMI 45.31 kg/m    Procedures  Procedures  ED Course / MDM    Medical Decision Making Risk OTC drugs. Prescription drug management.   Presentation consistent with mild peritonsillar abscess, patient would prefer to avoid CT given pregnancy.  On arrival patient having difficulty tolerating secretions and swallowing.  Initial plan to give dose of IV Unasyn but patient has penicillin allergy, and given pregnancy would like to avoid clindamycin.  Dr. Posey Rea discussed with pharmacy who recommend IV Rocephin and then cefpodoxime orally as an outpatient if patient clinically improves.  After Tylenol, viscous lidocaine, IM Decadron and IV antibiotics patient is feeling much better, now tolerating p.o. fluids and medications and would like to go home.  Discussed strict return precautions with patient.  Prescribed Medrol Dosepak, cefpodoxime and liquid Tylenol to use at home, patient instructed to call ENT first thing Monday morning for close follow-up but if she worsens over the weekend to return to the ED.    Final diagnoses:  Peritonsillar abscess   ED Discharge Orders          Ordered    cefpodoxime (VANTIN) 100 MG tablet  2 times daily        04/29/22 2151    methylPREDNISolone (MEDROL DOSEPAK) 4 MG TBPK tablet        04/29/22 2151     acetaminophen (TYLENOL) 160 MG/5ML elixir  Every 6 hours PRN        04/29/22 2151                  Dartha Lodge, PA-C 04/29/22 2204    Glendora Score, MD 04/29/22 [phone]

## 2022-04-29 NOTE — ED Triage Notes (Signed)
Pt returns for continued sore throat that she was seen for on 7/12. Pt is [redacted] weeks pregnant

## 2022-05-17 DIAGNOSIS — Z419 Encounter for procedure for purposes other than remedying health state, unspecified: Secondary | ICD-10-CM | POA: Diagnosis not present

## 2022-06-06 DIAGNOSIS — Z3689 Encounter for other specified antenatal screening: Secondary | ICD-10-CM | POA: Diagnosis not present

## 2022-06-17 DIAGNOSIS — Z419 Encounter for procedure for purposes other than remedying health state, unspecified: Secondary | ICD-10-CM | POA: Diagnosis not present

## 2022-07-04 DIAGNOSIS — Z3689 Encounter for other specified antenatal screening: Secondary | ICD-10-CM | POA: Diagnosis not present

## 2022-07-17 DIAGNOSIS — Z419 Encounter for procedure for purposes other than remedying health state, unspecified: Secondary | ICD-10-CM | POA: Diagnosis not present

## 2022-08-01 DIAGNOSIS — Z23 Encounter for immunization: Secondary | ICD-10-CM | POA: Diagnosis not present

## 2022-08-17 DIAGNOSIS — Z419 Encounter for procedure for purposes other than remedying health state, unspecified: Secondary | ICD-10-CM | POA: Diagnosis not present

## 2022-08-29 DIAGNOSIS — Z3689 Encounter for other specified antenatal screening: Secondary | ICD-10-CM | POA: Diagnosis not present

## 2022-08-29 DIAGNOSIS — Z3A28 28 weeks gestation of pregnancy: Secondary | ICD-10-CM | POA: Diagnosis not present

## 2022-08-29 DIAGNOSIS — Z23 Encounter for immunization: Secondary | ICD-10-CM | POA: Diagnosis not present

## 2022-09-05 DIAGNOSIS — Z3689 Encounter for other specified antenatal screening: Secondary | ICD-10-CM | POA: Diagnosis not present

## 2022-09-16 DIAGNOSIS — Z419 Encounter for procedure for purposes other than remedying health state, unspecified: Secondary | ICD-10-CM | POA: Diagnosis not present

## 2022-09-19 DIAGNOSIS — Z3689 Encounter for other specified antenatal screening: Secondary | ICD-10-CM | POA: Diagnosis not present

## 2022-09-26 DIAGNOSIS — O99333 Smoking (tobacco) complicating pregnancy, third trimester: Secondary | ICD-10-CM | POA: Diagnosis not present

## 2022-09-26 DIAGNOSIS — O99213 Obesity complicating pregnancy, third trimester: Secondary | ICD-10-CM | POA: Diagnosis not present

## 2022-09-26 DIAGNOSIS — Z363 Encounter for antenatal screening for malformations: Secondary | ICD-10-CM | POA: Diagnosis not present

## 2022-09-26 DIAGNOSIS — Z3A32 32 weeks gestation of pregnancy: Secondary | ICD-10-CM | POA: Diagnosis not present

## 2022-10-03 DIAGNOSIS — N898 Other specified noninflammatory disorders of vagina: Secondary | ICD-10-CM | POA: Diagnosis not present

## 2022-10-13 DIAGNOSIS — Z6841 Body Mass Index (BMI) 40.0 and over, adult: Secondary | ICD-10-CM | POA: Diagnosis not present

## 2022-10-13 DIAGNOSIS — O99213 Obesity complicating pregnancy, third trimester: Secondary | ICD-10-CM | POA: Diagnosis not present

## 2022-10-17 DIAGNOSIS — Z419 Encounter for procedure for purposes other than remedying health state, unspecified: Secondary | ICD-10-CM | POA: Diagnosis not present

## 2022-10-20 DIAGNOSIS — O99213 Obesity complicating pregnancy, third trimester: Secondary | ICD-10-CM | POA: Diagnosis not present

## 2022-10-20 DIAGNOSIS — Z3A35 35 weeks gestation of pregnancy: Secondary | ICD-10-CM | POA: Diagnosis not present

## 2022-10-20 DIAGNOSIS — Z362 Encounter for other antenatal screening follow-up: Secondary | ICD-10-CM | POA: Diagnosis not present

## 2022-10-20 DIAGNOSIS — Z6841 Body Mass Index (BMI) 40.0 and over, adult: Secondary | ICD-10-CM | POA: Diagnosis not present

## 2022-10-27 DIAGNOSIS — O99213 Obesity complicating pregnancy, third trimester: Secondary | ICD-10-CM | POA: Diagnosis not present

## 2022-11-05 DIAGNOSIS — Z3A38 38 weeks gestation of pregnancy: Secondary | ICD-10-CM | POA: Diagnosis not present

## 2022-11-05 DIAGNOSIS — O471 False labor at or after 37 completed weeks of gestation: Secondary | ICD-10-CM | POA: Diagnosis not present

## 2022-11-09 DIAGNOSIS — O99211 Obesity complicating pregnancy, first trimester: Secondary | ICD-10-CM | POA: Diagnosis not present

## 2022-11-09 DIAGNOSIS — Z3A38 38 weeks gestation of pregnancy: Secondary | ICD-10-CM | POA: Diagnosis not present

## 2022-11-09 DIAGNOSIS — Z6841 Body Mass Index (BMI) 40.0 and over, adult: Secondary | ICD-10-CM | POA: Diagnosis not present

## 2022-11-13 DIAGNOSIS — F17210 Nicotine dependence, cigarettes, uncomplicated: Secondary | ICD-10-CM | POA: Diagnosis not present

## 2022-11-13 DIAGNOSIS — O9081 Anemia of the puerperium: Secondary | ICD-10-CM | POA: Diagnosis not present

## 2022-11-13 DIAGNOSIS — O99214 Obesity complicating childbirth: Secondary | ICD-10-CM | POA: Diagnosis not present

## 2022-11-13 DIAGNOSIS — Z3A39 39 weeks gestation of pregnancy: Secondary | ICD-10-CM | POA: Diagnosis not present

## 2022-11-13 DIAGNOSIS — Z5941 Food insecurity: Secondary | ICD-10-CM | POA: Diagnosis not present

## 2022-11-13 DIAGNOSIS — O99334 Smoking (tobacco) complicating childbirth: Secondary | ICD-10-CM | POA: Diagnosis not present

## 2022-11-13 DIAGNOSIS — O3663X0 Maternal care for excessive fetal growth, third trimester, not applicable or unspecified: Secondary | ICD-10-CM | POA: Diagnosis not present

## 2022-11-13 DIAGNOSIS — D62 Acute posthemorrhagic anemia: Secondary | ICD-10-CM | POA: Diagnosis not present

## 2022-11-13 DIAGNOSIS — O324XX0 Maternal care for high head at term, not applicable or unspecified: Secondary | ICD-10-CM | POA: Diagnosis not present

## 2022-11-13 DIAGNOSIS — Z880 Allergy status to penicillin: Secondary | ICD-10-CM | POA: Diagnosis not present

## 2022-11-13 DIAGNOSIS — O328XX0 Maternal care for other malpresentation of fetus, not applicable or unspecified: Secondary | ICD-10-CM | POA: Diagnosis not present

## 2022-11-14 DIAGNOSIS — Z3A00 Weeks of gestation of pregnancy not specified: Secondary | ICD-10-CM | POA: Diagnosis not present

## 2022-11-14 DIAGNOSIS — Z3A40 40 weeks gestation of pregnancy: Secondary | ICD-10-CM | POA: Diagnosis not present

## 2022-11-14 DIAGNOSIS — Z3689 Encounter for other specified antenatal screening: Secondary | ICD-10-CM | POA: Diagnosis not present

## 2022-11-15 DIAGNOSIS — Z3A00 Weeks of gestation of pregnancy not specified: Secondary | ICD-10-CM | POA: Diagnosis not present

## 2022-11-15 DIAGNOSIS — O3663X0 Maternal care for excessive fetal growth, third trimester, not applicable or unspecified: Secondary | ICD-10-CM | POA: Diagnosis not present

## 2022-11-15 DIAGNOSIS — Z6841 Body Mass Index (BMI) 40.0 and over, adult: Secondary | ICD-10-CM | POA: Diagnosis not present

## 2022-11-15 DIAGNOSIS — O99214 Obesity complicating childbirth: Secondary | ICD-10-CM | POA: Diagnosis not present

## 2022-11-15 DIAGNOSIS — Z3A39 39 weeks gestation of pregnancy: Secondary | ICD-10-CM | POA: Diagnosis not present

## 2022-11-15 DIAGNOSIS — O326XX0 Maternal care for compound presentation, not applicable or unspecified: Secondary | ICD-10-CM | POA: Diagnosis not present

## 2022-11-17 DIAGNOSIS — Z419 Encounter for procedure for purposes other than remedying health state, unspecified: Secondary | ICD-10-CM | POA: Diagnosis not present

## 2022-12-16 DIAGNOSIS — Z419 Encounter for procedure for purposes other than remedying health state, unspecified: Secondary | ICD-10-CM | POA: Diagnosis not present

## 2023-01-16 DIAGNOSIS — Z419 Encounter for procedure for purposes other than remedying health state, unspecified: Secondary | ICD-10-CM | POA: Diagnosis not present

## 2023-02-15 DIAGNOSIS — Z419 Encounter for procedure for purposes other than remedying health state, unspecified: Secondary | ICD-10-CM | POA: Diagnosis not present

## 2023-02-23 DIAGNOSIS — R070 Pain in throat: Secondary | ICD-10-CM | POA: Diagnosis not present

## 2023-02-23 DIAGNOSIS — J329 Chronic sinusitis, unspecified: Secondary | ICD-10-CM | POA: Diagnosis not present

## 2023-03-18 DIAGNOSIS — Z419 Encounter for procedure for purposes other than remedying health state, unspecified: Secondary | ICD-10-CM | POA: Diagnosis not present

## 2023-04-17 DIAGNOSIS — Z419 Encounter for procedure for purposes other than remedying health state, unspecified: Secondary | ICD-10-CM | POA: Diagnosis not present

## 2023-05-18 DIAGNOSIS — Z419 Encounter for procedure for purposes other than remedying health state, unspecified: Secondary | ICD-10-CM | POA: Diagnosis not present

## 2023-05-22 DIAGNOSIS — R0981 Nasal congestion: Secondary | ICD-10-CM | POA: Diagnosis not present

## 2023-05-22 DIAGNOSIS — R059 Cough, unspecified: Secondary | ICD-10-CM | POA: Diagnosis not present

## 2023-06-18 DIAGNOSIS — Z419 Encounter for procedure for purposes other than remedying health state, unspecified: Secondary | ICD-10-CM | POA: Diagnosis not present

## 2023-07-18 DIAGNOSIS — Z419 Encounter for procedure for purposes other than remedying health state, unspecified: Secondary | ICD-10-CM | POA: Diagnosis not present

## 2023-08-18 DIAGNOSIS — Z419 Encounter for procedure for purposes other than remedying health state, unspecified: Secondary | ICD-10-CM | POA: Diagnosis not present

## 2023-09-17 DIAGNOSIS — Z419 Encounter for procedure for purposes other than remedying health state, unspecified: Secondary | ICD-10-CM | POA: Diagnosis not present

## 2023-10-18 DIAGNOSIS — Z419 Encounter for procedure for purposes other than remedying health state, unspecified: Secondary | ICD-10-CM | POA: Diagnosis not present

## 2023-11-13 DIAGNOSIS — R051 Acute cough: Secondary | ICD-10-CM | POA: Diagnosis not present

## 2023-11-13 DIAGNOSIS — J111 Influenza due to unidentified influenza virus with other respiratory manifestations: Secondary | ICD-10-CM | POA: Diagnosis not present

## 2023-11-13 DIAGNOSIS — Z20822 Contact with and (suspected) exposure to covid-19: Secondary | ICD-10-CM | POA: Diagnosis not present

## 2023-11-18 DIAGNOSIS — Z419 Encounter for procedure for purposes other than remedying health state, unspecified: Secondary | ICD-10-CM | POA: Diagnosis not present

## 2023-12-16 DIAGNOSIS — Z419 Encounter for procedure for purposes other than remedying health state, unspecified: Secondary | ICD-10-CM | POA: Diagnosis not present

## 2024-01-27 DIAGNOSIS — Z419 Encounter for procedure for purposes other than remedying health state, unspecified: Secondary | ICD-10-CM | POA: Diagnosis not present

## 2024-02-26 DIAGNOSIS — Z419 Encounter for procedure for purposes other than remedying health state, unspecified: Secondary | ICD-10-CM | POA: Diagnosis not present

## 2024-03-28 DIAGNOSIS — Z419 Encounter for procedure for purposes other than remedying health state, unspecified: Secondary | ICD-10-CM | POA: Diagnosis not present

## 2024-04-27 DIAGNOSIS — Z419 Encounter for procedure for purposes other than remedying health state, unspecified: Secondary | ICD-10-CM | POA: Diagnosis not present

## 2024-04-29 DIAGNOSIS — O3481 Maternal care for other abnormalities of pelvic organs, first trimester: Secondary | ICD-10-CM | POA: Diagnosis not present

## 2024-04-29 DIAGNOSIS — O99211 Obesity complicating pregnancy, first trimester: Secondary | ICD-10-CM | POA: Diagnosis not present

## 2024-04-29 DIAGNOSIS — N912 Amenorrhea, unspecified: Secondary | ICD-10-CM | POA: Diagnosis not present

## 2024-04-29 DIAGNOSIS — O99210 Obesity complicating pregnancy, unspecified trimester: Secondary | ICD-10-CM | POA: Diagnosis not present

## 2024-04-29 DIAGNOSIS — Z3201 Encounter for pregnancy test, result positive: Secondary | ICD-10-CM | POA: Diagnosis not present

## 2024-04-29 DIAGNOSIS — Z3A10 10 weeks gestation of pregnancy: Secondary | ICD-10-CM | POA: Diagnosis not present

## 2024-04-29 DIAGNOSIS — Z3689 Encounter for other specified antenatal screening: Secondary | ICD-10-CM | POA: Diagnosis not present

## 2024-04-29 DIAGNOSIS — O99331 Smoking (tobacco) complicating pregnancy, first trimester: Secondary | ICD-10-CM | POA: Diagnosis not present

## 2024-04-29 DIAGNOSIS — O3680X0 Pregnancy with inconclusive fetal viability, not applicable or unspecified: Secondary | ICD-10-CM | POA: Diagnosis not present

## 2024-04-29 DIAGNOSIS — O34219 Maternal care for unspecified type scar from previous cesarean delivery: Secondary | ICD-10-CM | POA: Diagnosis not present

## 2024-04-29 DIAGNOSIS — E6689 Other obesity not elsewhere classified: Secondary | ICD-10-CM | POA: Diagnosis not present

## 2024-04-29 DIAGNOSIS — N83201 Unspecified ovarian cyst, right side: Secondary | ICD-10-CM | POA: Diagnosis not present

## 2024-05-28 DIAGNOSIS — Z419 Encounter for procedure for purposes other than remedying health state, unspecified: Secondary | ICD-10-CM | POA: Diagnosis not present

## 2024-05-28 DIAGNOSIS — Z3689 Encounter for other specified antenatal screening: Secondary | ICD-10-CM | POA: Diagnosis not present

## 2024-06-26 DIAGNOSIS — Z3A18 18 weeks gestation of pregnancy: Secondary | ICD-10-CM | POA: Diagnosis not present

## 2024-06-26 DIAGNOSIS — Z3689 Encounter for other specified antenatal screening: Secondary | ICD-10-CM | POA: Diagnosis not present

## 2024-06-28 DIAGNOSIS — Z419 Encounter for procedure for purposes other than remedying health state, unspecified: Secondary | ICD-10-CM | POA: Diagnosis not present

## 2024-07-26 DIAGNOSIS — Z3689 Encounter for other specified antenatal screening: Secondary | ICD-10-CM | POA: Diagnosis not present

## 2024-08-28 DIAGNOSIS — Z419 Encounter for procedure for purposes other than remedying health state, unspecified: Secondary | ICD-10-CM | POA: Diagnosis not present

## 2024-08-30 DIAGNOSIS — Z3A28 28 weeks gestation of pregnancy: Secondary | ICD-10-CM | POA: Diagnosis not present

## 2024-08-30 DIAGNOSIS — Z3689 Encounter for other specified antenatal screening: Secondary | ICD-10-CM | POA: Diagnosis not present

## 2024-08-30 DIAGNOSIS — Z3483 Encounter for supervision of other normal pregnancy, third trimester: Secondary | ICD-10-CM | POA: Diagnosis not present

## 2024-08-30 DIAGNOSIS — Z362 Encounter for other antenatal screening follow-up: Secondary | ICD-10-CM | POA: Diagnosis not present

## 2024-09-10 DIAGNOSIS — O4703 False labor before 37 completed weeks of gestation, third trimester: Secondary | ICD-10-CM | POA: Diagnosis not present

## 2024-09-10 DIAGNOSIS — Z3A29 29 weeks gestation of pregnancy: Secondary | ICD-10-CM | POA: Diagnosis not present

## 2024-10-16 DIAGNOSIS — O99213 Obesity complicating pregnancy, third trimester: Secondary | ICD-10-CM | POA: Diagnosis not present

## 2024-10-16 DIAGNOSIS — Z6841 Body Mass Index (BMI) 40.0 and over, adult: Secondary | ICD-10-CM | POA: Diagnosis not present
